# Patient Record
Sex: Male | Born: 1940 | Race: White | Hispanic: No | Marital: Married | Smoking: Former smoker
Health system: Southern US, Community
[De-identification: ages and names within clinical notes are randomized; demographics above are authoritative.]

## PROBLEM LIST (undated history)

## (undated) DIAGNOSIS — E781 Pure hyperglyceridemia: Secondary | ICD-10-CM

## (undated) DIAGNOSIS — E041 Nontoxic single thyroid nodule: Secondary | ICD-10-CM

## (undated) DIAGNOSIS — I10 Essential (primary) hypertension: Secondary | ICD-10-CM

## (undated) DIAGNOSIS — G44009 Cluster headache syndrome, unspecified, not intractable: Secondary | ICD-10-CM

## (undated) DIAGNOSIS — E1140 Type 2 diabetes mellitus with diabetic neuropathy, unspecified: Secondary | ICD-10-CM

## (undated) HISTORY — DX: Pure hyperglyceridemia: E78.1

## (undated) HISTORY — DX: Type 2 diabetes mellitus with diabetic neuropathy, unspecified: E11.40

## (undated) HISTORY — DX: Cluster headache syndrome, unspecified, not intractable: G44.009

## (undated) HISTORY — DX: Nontoxic single thyroid nodule: E04.1

## (undated) HISTORY — PX: PROSTATE BIOPSY: SHX241

## (undated) HISTORY — DX: Essential (primary) hypertension: I10

---

## 2000-06-26 HISTORY — PX: ESOPHAGEAL DILATION: SHX303

## 2003-06-29 HISTORY — PX: POLYPECTOMY: SHX149

## 2013-03-14 DIAGNOSIS — K449 Diaphragmatic hernia without obstruction or gangrene: Secondary | ICD-10-CM

## 2013-03-14 HISTORY — DX: Diaphragmatic hernia without obstruction or gangrene: K44.9

## 2013-07-29 DIAGNOSIS — I10 Essential (primary) hypertension: Secondary | ICD-10-CM | POA: Diagnosis not present

## 2013-07-29 DIAGNOSIS — E7800 Pure hypercholesterolemia, unspecified: Secondary | ICD-10-CM | POA: Diagnosis not present

## 2013-07-29 DIAGNOSIS — Z23 Encounter for immunization: Secondary | ICD-10-CM | POA: Diagnosis not present

## 2013-07-29 DIAGNOSIS — E119 Type 2 diabetes mellitus without complications: Secondary | ICD-10-CM | POA: Diagnosis not present

## 2013-07-29 DIAGNOSIS — G44009 Cluster headache syndrome, unspecified, not intractable: Secondary | ICD-10-CM | POA: Diagnosis not present

## 2013-12-02 DIAGNOSIS — Z23 Encounter for immunization: Secondary | ICD-10-CM | POA: Diagnosis not present

## 2014-01-11 DIAGNOSIS — E119 Type 2 diabetes mellitus without complications: Secondary | ICD-10-CM | POA: Diagnosis not present

## 2014-01-19 DIAGNOSIS — E119 Type 2 diabetes mellitus without complications: Secondary | ICD-10-CM | POA: Diagnosis not present

## 2014-01-19 DIAGNOSIS — Z0000 Encounter for general adult medical examination without abnormal findings: Secondary | ICD-10-CM | POA: Diagnosis not present

## 2014-01-19 DIAGNOSIS — I10 Essential (primary) hypertension: Secondary | ICD-10-CM | POA: Diagnosis not present

## 2014-07-15 DIAGNOSIS — E119 Type 2 diabetes mellitus without complications: Secondary | ICD-10-CM | POA: Diagnosis not present

## 2014-07-22 DIAGNOSIS — I10 Essential (primary) hypertension: Secondary | ICD-10-CM | POA: Diagnosis not present

## 2014-07-22 DIAGNOSIS — Z1389 Encounter for screening for other disorder: Secondary | ICD-10-CM | POA: Diagnosis not present

## 2014-07-22 DIAGNOSIS — Z9181 History of falling: Secondary | ICD-10-CM | POA: Diagnosis not present

## 2014-07-22 DIAGNOSIS — E119 Type 2 diabetes mellitus without complications: Secondary | ICD-10-CM | POA: Diagnosis not present

## 2014-11-17 DIAGNOSIS — Z23 Encounter for immunization: Secondary | ICD-10-CM | POA: Diagnosis not present

## 2014-12-30 DIAGNOSIS — D1801 Hemangioma of skin and subcutaneous tissue: Secondary | ICD-10-CM | POA: Diagnosis not present

## 2014-12-30 DIAGNOSIS — L821 Other seborrheic keratosis: Secondary | ICD-10-CM | POA: Diagnosis not present

## 2014-12-30 DIAGNOSIS — L219 Seborrheic dermatitis, unspecified: Secondary | ICD-10-CM | POA: Diagnosis not present

## 2015-02-04 DIAGNOSIS — E119 Type 2 diabetes mellitus without complications: Secondary | ICD-10-CM | POA: Diagnosis not present

## 2015-02-10 DIAGNOSIS — E119 Type 2 diabetes mellitus without complications: Secondary | ICD-10-CM | POA: Diagnosis not present

## 2015-02-10 DIAGNOSIS — Z23 Encounter for immunization: Secondary | ICD-10-CM | POA: Diagnosis not present

## 2015-02-10 DIAGNOSIS — I10 Essential (primary) hypertension: Secondary | ICD-10-CM | POA: Diagnosis not present

## 2015-02-10 DIAGNOSIS — D649 Anemia, unspecified: Secondary | ICD-10-CM | POA: Diagnosis not present

## 2015-02-10 DIAGNOSIS — Z0000 Encounter for general adult medical examination without abnormal findings: Secondary | ICD-10-CM | POA: Diagnosis not present

## 2015-02-15 DIAGNOSIS — Z1211 Encounter for screening for malignant neoplasm of colon: Secondary | ICD-10-CM | POA: Diagnosis not present

## 2015-10-24 ENCOUNTER — Other Ambulatory Visit: Payer: Self-pay

## 2015-11-11 DIAGNOSIS — E119 Type 2 diabetes mellitus without complications: Secondary | ICD-10-CM | POA: Diagnosis not present

## 2015-11-15 DIAGNOSIS — E119 Type 2 diabetes mellitus without complications: Secondary | ICD-10-CM | POA: Diagnosis not present

## 2015-11-15 DIAGNOSIS — Z23 Encounter for immunization: Secondary | ICD-10-CM | POA: Diagnosis not present

## 2015-11-15 DIAGNOSIS — Z0000 Encounter for general adult medical examination without abnormal findings: Secondary | ICD-10-CM | POA: Diagnosis not present

## 2015-11-15 DIAGNOSIS — I10 Essential (primary) hypertension: Secondary | ICD-10-CM | POA: Diagnosis not present

## 2015-11-15 DIAGNOSIS — Z125 Encounter for screening for malignant neoplasm of prostate: Secondary | ICD-10-CM | POA: Diagnosis not present

## 2016-02-06 DIAGNOSIS — M25561 Pain in right knee: Secondary | ICD-10-CM | POA: Diagnosis not present

## 2016-02-06 DIAGNOSIS — S20211A Contusion of right front wall of thorax, initial encounter: Secondary | ICD-10-CM | POA: Diagnosis not present

## 2016-02-06 DIAGNOSIS — M542 Cervicalgia: Secondary | ICD-10-CM | POA: Diagnosis not present

## 2016-02-06 DIAGNOSIS — M545 Low back pain: Secondary | ICD-10-CM | POA: Diagnosis not present

## 2016-07-30 DIAGNOSIS — E119 Type 2 diabetes mellitus without complications: Secondary | ICD-10-CM | POA: Diagnosis not present

## 2016-08-01 DIAGNOSIS — I10 Essential (primary) hypertension: Secondary | ICD-10-CM | POA: Diagnosis not present

## 2016-08-01 DIAGNOSIS — E119 Type 2 diabetes mellitus without complications: Secondary | ICD-10-CM | POA: Diagnosis not present

## 2016-08-01 DIAGNOSIS — Z6826 Body mass index (BMI) 26.0-26.9, adult: Secondary | ICD-10-CM | POA: Diagnosis not present

## 2016-08-01 DIAGNOSIS — Z0000 Encounter for general adult medical examination without abnormal findings: Secondary | ICD-10-CM | POA: Diagnosis not present

## 2017-01-01 DIAGNOSIS — Z23 Encounter for immunization: Secondary | ICD-10-CM | POA: Diagnosis not present

## 2017-01-02 DIAGNOSIS — E119 Type 2 diabetes mellitus without complications: Secondary | ICD-10-CM | POA: Diagnosis not present

## 2017-01-03 DIAGNOSIS — L300 Nummular dermatitis: Secondary | ICD-10-CM | POA: Diagnosis not present

## 2017-01-03 DIAGNOSIS — L299 Pruritus, unspecified: Secondary | ICD-10-CM | POA: Diagnosis not present

## 2017-01-03 DIAGNOSIS — L98499 Non-pressure chronic ulcer of skin of other sites with unspecified severity: Secondary | ICD-10-CM | POA: Diagnosis not present

## 2017-01-07 DIAGNOSIS — Z9181 History of falling: Secondary | ICD-10-CM | POA: Diagnosis not present

## 2017-01-07 DIAGNOSIS — Z1339 Encounter for screening examination for other mental health and behavioral disorders: Secondary | ICD-10-CM | POA: Diagnosis not present

## 2017-01-07 DIAGNOSIS — E119 Type 2 diabetes mellitus without complications: Secondary | ICD-10-CM | POA: Diagnosis not present

## 2017-01-07 DIAGNOSIS — Z1331 Encounter for screening for depression: Secondary | ICD-10-CM | POA: Diagnosis not present

## 2017-03-25 DIAGNOSIS — C44319 Basal cell carcinoma of skin of other parts of face: Secondary | ICD-10-CM | POA: Diagnosis not present

## 2017-04-04 DIAGNOSIS — E119 Type 2 diabetes mellitus without complications: Secondary | ICD-10-CM | POA: Diagnosis not present

## 2017-04-08 DIAGNOSIS — Z1331 Encounter for screening for depression: Secondary | ICD-10-CM | POA: Diagnosis not present

## 2017-04-08 DIAGNOSIS — E119 Type 2 diabetes mellitus without complications: Secondary | ICD-10-CM | POA: Diagnosis not present

## 2017-04-08 DIAGNOSIS — Z9181 History of falling: Secondary | ICD-10-CM | POA: Diagnosis not present

## 2017-04-08 DIAGNOSIS — Z6827 Body mass index (BMI) 27.0-27.9, adult: Secondary | ICD-10-CM | POA: Diagnosis not present

## 2017-11-06 DIAGNOSIS — E119 Type 2 diabetes mellitus without complications: Secondary | ICD-10-CM | POA: Diagnosis not present

## 2017-11-08 DIAGNOSIS — Z23 Encounter for immunization: Secondary | ICD-10-CM | POA: Diagnosis not present

## 2017-11-08 DIAGNOSIS — E119 Type 2 diabetes mellitus without complications: Secondary | ICD-10-CM | POA: Diagnosis not present

## 2017-11-08 DIAGNOSIS — I10 Essential (primary) hypertension: Secondary | ICD-10-CM | POA: Diagnosis not present

## 2017-11-08 DIAGNOSIS — Z0000 Encounter for general adult medical examination without abnormal findings: Secondary | ICD-10-CM | POA: Diagnosis not present

## 2017-11-08 DIAGNOSIS — Z6826 Body mass index (BMI) 26.0-26.9, adult: Secondary | ICD-10-CM | POA: Diagnosis not present

## 2018-03-19 DIAGNOSIS — B079 Viral wart, unspecified: Secondary | ICD-10-CM | POA: Diagnosis not present

## 2018-04-09 DIAGNOSIS — B079 Viral wart, unspecified: Secondary | ICD-10-CM | POA: Diagnosis not present

## 2018-06-13 DIAGNOSIS — E119 Type 2 diabetes mellitus without complications: Secondary | ICD-10-CM | POA: Diagnosis not present

## 2018-06-18 DIAGNOSIS — Z6827 Body mass index (BMI) 27.0-27.9, adult: Secondary | ICD-10-CM | POA: Diagnosis not present

## 2018-06-18 DIAGNOSIS — Z9181 History of falling: Secondary | ICD-10-CM | POA: Diagnosis not present

## 2018-06-18 DIAGNOSIS — E1140 Type 2 diabetes mellitus with diabetic neuropathy, unspecified: Secondary | ICD-10-CM | POA: Diagnosis not present

## 2018-06-18 DIAGNOSIS — Z1331 Encounter for screening for depression: Secondary | ICD-10-CM | POA: Diagnosis not present

## 2018-11-08 DIAGNOSIS — Z23 Encounter for immunization: Secondary | ICD-10-CM | POA: Diagnosis not present

## 2018-12-17 DIAGNOSIS — E1140 Type 2 diabetes mellitus with diabetic neuropathy, unspecified: Secondary | ICD-10-CM | POA: Diagnosis not present

## 2018-12-19 DIAGNOSIS — Z0000 Encounter for general adult medical examination without abnormal findings: Secondary | ICD-10-CM | POA: Diagnosis not present

## 2018-12-19 DIAGNOSIS — Z1331 Encounter for screening for depression: Secondary | ICD-10-CM | POA: Diagnosis not present

## 2018-12-19 DIAGNOSIS — Z6826 Body mass index (BMI) 26.0-26.9, adult: Secondary | ICD-10-CM | POA: Diagnosis not present

## 2018-12-19 DIAGNOSIS — E119 Type 2 diabetes mellitus without complications: Secondary | ICD-10-CM | POA: Diagnosis not present

## 2019-02-26 DIAGNOSIS — I10 Essential (primary) hypertension: Secondary | ICD-10-CM | POA: Diagnosis not present

## 2019-02-26 DIAGNOSIS — E1140 Type 2 diabetes mellitus with diabetic neuropathy, unspecified: Secondary | ICD-10-CM | POA: Diagnosis not present

## 2019-02-26 DIAGNOSIS — E781 Pure hyperglyceridemia: Secondary | ICD-10-CM | POA: Diagnosis not present

## 2019-05-25 DIAGNOSIS — E041 Nontoxic single thyroid nodule: Secondary | ICD-10-CM | POA: Diagnosis not present

## 2019-05-25 DIAGNOSIS — Z79899 Other long term (current) drug therapy: Secondary | ICD-10-CM | POA: Diagnosis not present

## 2019-05-25 DIAGNOSIS — E1140 Type 2 diabetes mellitus with diabetic neuropathy, unspecified: Secondary | ICD-10-CM | POA: Diagnosis not present

## 2019-05-27 DIAGNOSIS — Z6827 Body mass index (BMI) 27.0-27.9, adult: Secondary | ICD-10-CM | POA: Diagnosis not present

## 2019-05-27 DIAGNOSIS — Z9181 History of falling: Secondary | ICD-10-CM | POA: Diagnosis not present

## 2019-05-27 DIAGNOSIS — E1165 Type 2 diabetes mellitus with hyperglycemia: Secondary | ICD-10-CM | POA: Diagnosis not present

## 2019-05-27 DIAGNOSIS — E1140 Type 2 diabetes mellitus with diabetic neuropathy, unspecified: Secondary | ICD-10-CM | POA: Diagnosis not present

## 2019-05-27 DIAGNOSIS — Z1331 Encounter for screening for depression: Secondary | ICD-10-CM | POA: Diagnosis not present

## 2019-05-27 DIAGNOSIS — E781 Pure hyperglyceridemia: Secondary | ICD-10-CM | POA: Diagnosis not present

## 2019-05-27 DIAGNOSIS — I10 Essential (primary) hypertension: Secondary | ICD-10-CM | POA: Diagnosis not present

## 2019-06-05 DIAGNOSIS — L720 Epidermal cyst: Secondary | ICD-10-CM | POA: Diagnosis not present

## 2019-06-24 DIAGNOSIS — K222 Esophageal obstruction: Secondary | ICD-10-CM | POA: Diagnosis not present

## 2019-06-26 DIAGNOSIS — Z20828 Contact with and (suspected) exposure to other viral communicable diseases: Secondary | ICD-10-CM | POA: Diagnosis not present

## 2019-06-26 DIAGNOSIS — N4340 Spermatocele of epididymis, unspecified: Secondary | ICD-10-CM | POA: Diagnosis not present

## 2019-06-26 DIAGNOSIS — R3911 Hesitancy of micturition: Secondary | ICD-10-CM | POA: Diagnosis not present

## 2019-06-26 DIAGNOSIS — Z1159 Encounter for screening for other viral diseases: Secondary | ICD-10-CM | POA: Diagnosis not present

## 2019-06-26 DIAGNOSIS — J3489 Other specified disorders of nose and nasal sinuses: Secondary | ICD-10-CM | POA: Diagnosis not present

## 2019-06-26 DIAGNOSIS — N401 Enlarged prostate with lower urinary tract symptoms: Secondary | ICD-10-CM | POA: Diagnosis not present

## 2019-07-02 DIAGNOSIS — Z808 Family history of malignant neoplasm of other organs or systems: Secondary | ICD-10-CM | POA: Diagnosis not present

## 2019-07-02 DIAGNOSIS — E119 Type 2 diabetes mellitus without complications: Secondary | ICD-10-CM | POA: Diagnosis not present

## 2019-07-02 DIAGNOSIS — Z1211 Encounter for screening for malignant neoplasm of colon: Secondary | ICD-10-CM | POA: Diagnosis not present

## 2019-07-02 DIAGNOSIS — Z09 Encounter for follow-up examination after completed treatment for conditions other than malignant neoplasm: Secondary | ICD-10-CM | POA: Diagnosis not present

## 2019-07-02 DIAGNOSIS — K648 Other hemorrhoids: Secondary | ICD-10-CM | POA: Diagnosis not present

## 2019-07-02 DIAGNOSIS — Z79899 Other long term (current) drug therapy: Secondary | ICD-10-CM | POA: Diagnosis not present

## 2019-07-02 DIAGNOSIS — Z7982 Long term (current) use of aspirin: Secondary | ICD-10-CM | POA: Diagnosis not present

## 2019-07-02 DIAGNOSIS — I10 Essential (primary) hypertension: Secondary | ICD-10-CM | POA: Diagnosis not present

## 2019-07-02 DIAGNOSIS — Z86010 Personal history of colonic polyps: Secondary | ICD-10-CM | POA: Diagnosis not present

## 2019-08-12 DIAGNOSIS — R3911 Hesitancy of micturition: Secondary | ICD-10-CM | POA: Diagnosis not present

## 2019-08-12 DIAGNOSIS — N401 Enlarged prostate with lower urinary tract symptoms: Secondary | ICD-10-CM | POA: Diagnosis not present

## 2019-08-12 DIAGNOSIS — N4340 Spermatocele of epididymis, unspecified: Secondary | ICD-10-CM | POA: Diagnosis not present

## 2019-09-15 DIAGNOSIS — R079 Chest pain, unspecified: Secondary | ICD-10-CM | POA: Diagnosis not present

## 2019-09-18 ENCOUNTER — Encounter: Payer: Self-pay | Admitting: Cardiology

## 2019-09-18 DIAGNOSIS — G44009 Cluster headache syndrome, unspecified, not intractable: Secondary | ICD-10-CM

## 2019-09-18 DIAGNOSIS — E781 Pure hyperglyceridemia: Secondary | ICD-10-CM

## 2019-09-18 DIAGNOSIS — I10 Essential (primary) hypertension: Secondary | ICD-10-CM | POA: Insufficient documentation

## 2019-09-18 DIAGNOSIS — E785 Hyperlipidemia, unspecified: Secondary | ICD-10-CM | POA: Insufficient documentation

## 2019-09-18 DIAGNOSIS — E1140 Type 2 diabetes mellitus with diabetic neuropathy, unspecified: Secondary | ICD-10-CM | POA: Insufficient documentation

## 2019-09-18 DIAGNOSIS — E041 Nontoxic single thyroid nodule: Secondary | ICD-10-CM

## 2019-09-28 ENCOUNTER — Ambulatory Visit (INDEPENDENT_AMBULATORY_CARE_PROVIDER_SITE_OTHER): Payer: Medicare Other

## 2019-09-28 ENCOUNTER — Encounter: Payer: Self-pay | Admitting: Cardiology

## 2019-09-28 ENCOUNTER — Ambulatory Visit (INDEPENDENT_AMBULATORY_CARE_PROVIDER_SITE_OTHER): Payer: Medicare Other | Admitting: Cardiology

## 2019-09-28 ENCOUNTER — Other Ambulatory Visit: Payer: Self-pay

## 2019-09-28 VITALS — BP 128/58 | HR 84 | Ht 72.0 in | Wt 201.4 lb

## 2019-09-28 DIAGNOSIS — E119 Type 2 diabetes mellitus without complications: Secondary | ICD-10-CM | POA: Insufficient documentation

## 2019-09-28 DIAGNOSIS — I10 Essential (primary) hypertension: Secondary | ICD-10-CM | POA: Diagnosis not present

## 2019-09-28 DIAGNOSIS — R079 Chest pain, unspecified: Secondary | ICD-10-CM

## 2019-09-28 DIAGNOSIS — R0989 Other specified symptoms and signs involving the circulatory and respiratory systems: Secondary | ICD-10-CM

## 2019-09-28 DIAGNOSIS — I493 Ventricular premature depolarization: Secondary | ICD-10-CM

## 2019-09-28 DIAGNOSIS — R931 Abnormal findings on diagnostic imaging of heart and coronary circulation: Secondary | ICD-10-CM

## 2019-09-28 HISTORY — DX: Abnormal findings on diagnostic imaging of heart and coronary circulation: R93.1

## 2019-09-28 HISTORY — DX: Type 2 diabetes mellitus without complications: E11.9

## 2019-09-28 HISTORY — DX: Ventricular premature depolarization: I49.3

## 2019-09-28 HISTORY — DX: Other specified symptoms and signs involving the circulatory and respiratory systems: R09.89

## 2019-09-28 HISTORY — DX: Chest pain, unspecified: R07.9

## 2019-09-28 MED ORDER — NITROGLYCERIN 0.4 MG SL SUBL
0.4000 mg | SUBLINGUAL_TABLET | SUBLINGUAL | 3 refills | Status: DC | PRN
Start: 2019-09-28 — End: 2023-06-20

## 2019-09-28 NOTE — Patient Instructions (Addendum)
Medication Instructions:  Start: Nitroglycerin 0.4 mg every 5 minutes as needed for chest pain    *If you need a refill on your cardiac medications before your next appointment, please call your pharmacy*   Lab Work: None ordered   If you have labs (blood work) drawn today and your tests are completely normal, you will receive your results only by: Marland Kitchen MyChart Message (if you have MyChart) OR . A paper copy in the mail If you have any lab test that is abnormal or we need to change your treatment, we will call you to review the results.   Testing/Procedures: Your physician has requested that you have an echocardiogram. Echocardiography is a painless test that uses sound waves to create images of your heart. It provides your doctor with information about the size and shape of your heart and how well your heart's chambers and valves are working. This procedure takes approximately one hour. There are no restrictions for this procedure.  A zio monitor was ordered today. It will remain on for 3 days. You will then return monitor and event diary in provided box. It takes 1-2 weeks for report to be downloaded and returned to [place]. We will call you with the results. If monitor falls off or has orange flashing light, please call [employer]io for further instructions.     Follow-Up: At [facility], you and your health needs are our priority.  As part of our continuing mission to provide you with exceptional heart care, we have created designated Provider Care Teams.  These Care Teams include your primary Cardiologist (physician) and Advanced Practice Providers (APPs -  Physician Assistants and Nurse Practitioners) who all work together to provide you with the care you need, when you need it.  We recommend signing up for the patient portal called "MyChart".  Sign up information is provided on this After Visit Summary.  MyChart is used to connect with patients for Virtual Visits (Telemedicine).  Patients are able  to view lab/test results, encounter notes, upcoming appointments, etc.  Non-urgent messages can be sent to your provider as well.   To learn more about what you can do with MyChart, go to [email or web address].    Your next appointment:   1 month(s)  The format for your next appointment:   In Person  Provider:   Berniece Salines, DO   Other Instructions Nitroglycerin sublingual tablets What is this medicine? NITROGLYCERIN (nye troe GLI ser in) is a type of vasodilator. It relaxes blood vessels, increasing the blood and oxygen supply to your heart. This medicine is used to relieve chest pain caused by angina. It is also used to prevent chest pain before activities like climbing stairs, going outdoors in cold weather, or sexual activity. This medicine may be used for other purposes; ask your health care provider or pharmacist if you have questions. COMMON BRAND NAME(S): Nitroquick, Nitrostat, Nitrotab What should I tell my health care provider before I take this medicine? They need to know if you have any of these conditions:  anemia  head injury, recent stroke, or bleeding in the brain  liver disease  previous heart attack  an unusual or allergic reaction to nitroglycerin, other medicines, foods, dyes, or preservatives  pregnant or trying to get pregnant  breast-feeding How should I use this medicine? Take this medicine by mouth as needed. At the first sign of an angina attack (chest pain or tightness) place one tablet under your tongue. You can also take this medicine 5 to  10 minutes before an event likely to produce chest pain. Follow the directions on the prescription label. Let the tablet dissolve under the tongue. Do not swallow whole. Replace the dose if you accidentally swallow it. It will help if your mouth is not dry. Saliva around the tablet will help it to dissolve more quickly. Do not eat or drink, smoke or chew tobacco while a tablet is dissolving. If you are not better  within 5 minutes after taking ONE dose of nitroglycerin, call 9-1-1 immediately to seek emergency medical care. Do not take more than 3 nitroglycerin tablets over 15 minutes. If you take this medicine often to relieve symptoms of angina, your doctor or health care professional may provide you with different instructions to manage your symptoms. If symptoms do not go away after following these instructions, it is important to call 9-1-1 immediately. Do not take more than 3 nitroglycerin tablets over 15 minutes. Talk to your pediatrician regarding the use of this medicine in children. Special care may be needed. Overdosage: If you think you have taken too much of this medicine contact a poison control center or emergency room at once. NOTE: This medicine is only for you. Do not share this medicine with others. What if I miss a dose? This does not apply. This medicine is only used as needed. What may interact with this medicine? Do not take this medicine with any of the following medications:  certain migraine medicines like ergotamine and dihydroergotamine (DHE)  medicines used to treat erectile dysfunction like sildenafil, tadalafil, and vardenafil  riociguat This medicine may also interact with the following medications:  alteplase  aspirin  heparin  medicines for high blood pressure  medicines for mental depression  other medicines used to treat angina  phenothiazines like chlorpromazine, mesoridazine, prochlorperazine, thioridazine This list may not describe all possible interactions. Give your health care provider a list of all the medicines, herbs, non-prescription drugs, or dietary supplements you use. Also tell them if you smoke, drink alcohol, or use illegal drugs. Some items may interact with your medicine. What should I watch for while using this medicine? Tell your doctor or health care professional if you feel your medicine is no longer working. Keep this medicine with you  at all times. Sit or lie down when you take your medicine to prevent falling if you feel dizzy or faint after using it. Try to remain calm. This will help you to feel better faster. If you feel dizzy, take several deep breaths and lie down with your feet propped up, or bend forward with your head resting between your knees. You may get drowsy or dizzy. Do not drive, use machinery, or do anything that needs mental alertness until you know how this drug affects you. Do not stand or sit up quickly, especially if you are an older patient. This reduces the risk of dizzy or fainting spells. Alcohol can make you more drowsy and dizzy. Avoid alcoholic drinks. Do not treat yourself for coughs, colds, or pain while you are taking this medicine without asking your doctor or health care professional for advice. Some ingredients may increase your blood pressure. What side effects may I notice from receiving this medicine? Side effects that you should report to your doctor or health care professional as soon as possible:  blurred vision  dry mouth  skin rash  sweating  the feeling of extreme pressure in the head  unusually weak or tired Side effects that usually do not require medical  attention (report to your doctor or health care professional if they continue or are bothersome):  flushing of the face or neck  headache  irregular heartbeat, palpitations  nausea, vomiting This list may not describe all possible side effects. Call your doctor for medical advice about side effects. You may report side effects to FDA at 1-800-FDA-1088. Where should I keep my medicine? Keep out of the reach of children. Store at room temperature between 20 and 25 degrees C (68 and 77 degrees F). Store in [employer]. Protect from light and moisture. Keep tightly closed. Throw away any unused medicine after the expiration date. NOTE: This sheet is a summary. It may not cover all possible information. If you have  questions about this medicine, talk to your doctor, pharmacist, or health care provider.  2020 [employer] Standard (2012-12-11 17:57:36)

## 2019-09-28 NOTE — Progress Notes (Signed)
Cardiology Office Note:    Date:  09/28/2019   ID:  Donald Arias, DOB [birth year], MRN [id number]  PCP:  Ronita Hipps, MD  Cardiologist:  Berniece Salines, DO  Electrophysiologist:  None   Referring MD: Ronita Hipps, MD   " I was referred from her PCP because of my stress test."  History of Present Illness:    Donald Arias is a 79 y.o. male with a hx of diabetes mellitus, hypertension, hyperlipidemia presents today after being referred by his PCP due to a intermediate risk stress test that was done at [facility]. Patient tells me that he has been experiencing intermittent left-sided chest pain prior to seeing his primary care provider who recommended he get a stress test.  The stress test at [facility] showed no infarction or ischemia however due to depressed ejection fraction this was deemed to be intermediate risk study.  Today he notes that he still does have some intermittent chest pain but not as much as prior to stress test.  He denies palpitations.  But does tell me he feels skipped beats at times.  No other complaints at this time.  Patient is here today with his wife.   Past Medical History:  Diagnosis Date   Diabetes mellitus with neuropathy ([place])    Hiatal hernia 03/14/2013   Found during EGD procedure   Hypertension    Hypertriglyceridemia    Migraine-cluster headache syndrome    Thyroid nodule     Past Surgical History:  Procedure Laterality Date   ESOPHAGEAL DILATION  06/2000   Mild esophageal stricture dilated, Dr. Lyda Jester   POLYPECTOMY  06/29/2003   Diminutive Hyperplastic polyps   PROSTATE BIOPSY     For elevated PSA    Current Medications: Current Meds  Medication Sig   aspirin EC 81 MG tablet Take 81 mg by mouth daily. Swallow whole.   cholecalciferol (VITAMIN D3) 25 MCG (1000 UNIT) tablet Take 1,000 Units by mouth daily.   CINNAMON PO Take 1 capsule by mouth daily.   Coenzyme Q10 100 MG TABS Take 1 tablet by mouth daily.     lisinopril-hydrochlorothiazide (ZESTORETIC) 10-12.5 MG tablet Take 1 tablet by mouth daily.   Multiple Vitamins-Minerals (CENTRUM SILVER PO) Take 1 tablet by mouth daily.   Omega-3 Fatty Acids (OMEGA 3 500 PO) Take 1 capsule by mouth daily.   sitaGLIPtin (JANUVIA) 100 MG tablet Take 100 mg by mouth daily.   TURMERIC PO Take 1 tablet by mouth daily.   verapamil (CALAN-SR) 240 MG CR tablet Take 240 mg by mouth at bedtime.     Allergies:   Patient has no known allergies.   Social History   Socioeconomic History   Marital status: Married    Spouse name: Not on file   Number of children: Not on file   Years of education: Not on file   Highest education level: Not on file  Occupational History   Not on file  Tobacco Use   Smoking status: Former Smoker    Types: Cigarettes   Smokeless tobacco: Never Used  Substance and Sexual Activity   Alcohol use: Yes    Comment: Bottle of wine per week   Drug use: Not on file   Sexual activity: Not on file  Other Topics Concern   Not on file  Social History Narrative   Not on file   Social Determinants of Health   Financial Resource Strain:    Difficulty of Paying Living Expenses:   Food  Insecurity:    Worried About Charity fundraiser in the Last Year:    Arboriculturist in the Last Year:   Transportation Needs:    Film/video editor (Medical):    Lack of Transportation (Non-Medical):   Physical Activity:    Days of Exercise per Week:    Minutes of Exercise per Session:   Stress:    Feeling of Stress :   Social Connections:    Frequency of Communication with Friends and Family:    Frequency of Social Gatherings with Friends and Family:    Attends Religious Services:    Active Member of Clubs or Organizations:    Attends Archivist Meetings:    Marital Status:      Family History: The patient's family history includes Lung cancer in his father.  ROS:   Review of Systems   Constitution: Negative for decreased appetite, fever and weight gain.  HENT: Negative for congestion, ear discharge, hoarse voice and sore throat.   Eyes: Negative for discharge, redness, vision loss in right eye and visual halos.  Cardiovascular: Reports chest pain.  Negative for dyspnea on exertion, leg swelling, orthopnea and palpitations.  Respiratory: Negative for cough, hemoptysis, shortness of breath and snoring.   Endocrine: Negative for heat intolerance and polyphagia.  Hematologic/Lymphatic: Negative for bleeding problem. Does not bruise/bleed easily.  Skin: Negative for flushing, nail changes, rash and suspicious lesions.  Musculoskeletal: Negative for arthritis, joint pain, muscle cramps, myalgias, neck pain and stiffness.  Gastrointestinal: Negative for abdominal pain, bowel incontinence, diarrhea and excessive appetite.  Genitourinary: Negative for decreased libido, genital sores and incomplete emptying.  Neurological: Negative for brief paralysis, focal weakness, headaches and loss of balance.  Psychiatric/Behavioral: Negative for altered mental status, depression and suicidal ideas.  Allergic/Immunologic: Negative for HIV exposure and persistent infections.    EKGs/Labs/Other Studies Reviewed:    The following studies were reviewed today:   EKG: Sinus rhythm, heart rate 84 bpm with occasional PVCs.  Stress test done on September 14, 2019 at [facility] showed no reversible ischemia or infarction.  Wall motion was normal.  Left ventricle ejection fraction 45%.  Deemed intermediate risk study due to ejection fraction.  Recent Labs: No results found for requested labs within last 8760 hours.  Recent Lipid Panel No results found for: CHOL, TRIG, HDL, CHOLHDL, VLDL, LDLCALC, LDLDIRECT  Physical Exam:    VS:  BP (!) 128/58 (BP Location: Left Arm)    Pulse 84    Ht 6' (1.829 m)    Wt 201 lb 6.4 oz (91.4 kg)    SpO2 97%    BMI 27.31 kg/m     Wt Readings from Last 3  Encounters:  09/28/19 201 lb 6.4 oz (91.4 kg)  09/07/19 201 lb (91.2 kg)     GEN: Well nourished, well developed in no acute distress HEENT: Normal NECK: No JVD; No carotid bruits LYMPHATICS: No lymphadenopathy CARDIAC: S1S2 noted,RRR, no murmurs, rubs, gallops RESPIRATORY:  Clear to auscultation without rales, wheezing or rhonchi  ABDOMEN: Soft, non-tender, non-distended, +bowel sounds, no guarding. EXTREMITIES: No edema, No cyanosis, no clubbing MUSCULOSKELETAL:  No deformity  SKIN: Warm and dry NEUROLOGIC:  Alert and oriented x 3, non-focal PSYCHIATRIC:  Normal affect, good insight  ASSESSMENT:    1. Essential hypertension   2. Type 2 diabetes mellitus without complication, without long-term current use of insulin ([place])   3. PVC (premature ventricular contraction)   4. Depressed left ventricular ejection fraction  5. Chest pain of uncertain etiology    PLAN:     I was able to review the patient stress test from [facility] does not show any reversible ischemia or infarction.  Has been some improvement in the chest pain but he said he gets this sometimes .  As needed sublingual nitroglycerin has been sent to the patient pharmacy.  I have educated patient how to use this medication.  Also advised the patient that if he experience chest pains that persist to go to the nearest emergency department.  In terms of his EF of 45% reported on the stress test.  I am going to repeat an echocardiogram to understand good assessment of his LV function.  If this is truly a depressed ejection fraction he will need to be on a low-dose beta-blocker as well.  Given the PVC noted patient was on his EKG I like to do a ZIO monitor for 3 days to understand the patient's PVC burden.   The patient and his wife are both in agreement with the above plan. The patient left the office in stable condition.  The patient will follow up in 1 month or sooner if needed.   Medication Adjustments/Labs and  Tests Ordered: Current medicines are reviewed at length with the patient today.  Concerns regarding medicines are outlined above.  Orders Placed This Encounter  Procedures   LONG TERM MONITOR (3-14 DAYS)   EKG 12-Lead   ECHOCARDIOGRAM COMPLETE   Meds ordered this encounter  Medications   nitroGLYCERIN (NITROSTAT) 0.4 MG SL tablet    Sig: Place 1 tablet (0.4 mg total) under the tongue every 5 (five) minutes as needed for chest pain.    Dispense:  25 tablet    Refill:  3    Patient Instructions  Medication Instructions:  Start: Nitroglycerin 0.4 mg every 5 minutes as needed for chest pain    *If you need a refill on your cardiac medications before your next appointment, please call your pharmacy*   Lab Work: None ordered   If you have labs (blood work) drawn today and your tests are completely normal, you will receive your results only by:  [place] (if you have MyChart) OR  A paper copy in the mail If you have any lab test that is abnormal or we need to change your treatment, we will call you to review the results.   Testing/Procedures: Your physician has requested that you have an echocardiogram. Echocardiography is a painless test that uses sound waves to create images of your heart. It provides your doctor with information about the size and shape of your heart and how well your hearts chambers and valves are working. This procedure takes approximately one hour. There are no restrictions for this procedure.  A zio monitor was ordered today. It will remain on for 3 days. You will then return monitor and event diary in provided box. It takes 1-2 weeks for report to be downloaded and returned to [place]. We will call you with the results. If monitor falls off or has orange flashing light, please call Zio for further instructions.     Follow-Up: At [facility], you and your health needs are our priority.  As part of our continuing mission to provide you with  exceptional heart care, we have created designated Provider Care Teams.  These Care Teams include your primary Cardiologist (physician) and Advanced Practice Providers (APPs -  Physician Assistants and Nurse Practitioners) who all work together to provide  you with the care you need, when you need it.  We recommend signing up for the patient portal called "MyChart".  Sign up information is provided on this After Visit Summary.  MyChart is used to connect with patients for Virtual Visits (Telemedicine).  Patients are able to view lab/test results, encounter notes, upcoming appointments, etc.  Non-urgent messages can be sent to your provider as well.   To learn more about what you can do with MyChart, go to [email or web address].    Your next appointment:   1 month(s)  The format for your next appointment:   In Person  Provider:   Berniece Salines, DO   Other Instructions Nitroglycerin sublingual tablets What is this medicine? NITROGLYCERIN (nye troe GLI ser in) is a type of vasodilator. It relaxes blood vessels, increasing the blood and oxygen supply to your heart. This medicine is used to relieve chest pain caused by angina. It is also used to prevent chest pain before activities like climbing stairs, going outdoors in cold weather, or sexual activity. This medicine may be used for other purposes; ask your health care provider or pharmacist if you have questions. COMMON BRAND NAME(S): Nitroquick, Nitrostat, Nitrotab What should I tell my health care provider before I take this medicine? They need to know if you have any of these conditions:  anemia  head injury, recent stroke, or bleeding in the brain  liver disease  previous heart attack  an unusual or allergic reaction to nitroglycerin, other medicines, foods, dyes, or preservatives  pregnant or trying to get pregnant  breast-feeding How should I use this medicine? Take this medicine by mouth as needed. At the first sign of an  angina attack (chest pain or tightness) place one tablet under your tongue. You can also take this medicine 5 to 10 minutes before an event likely to produce chest pain. Follow the directions on the prescription label. Let the tablet dissolve under the tongue. Do not swallow whole. Replace the dose if you accidentally swallow it. It will help if your mouth is not dry. Saliva around the tablet will help it to dissolve more quickly. Do not eat or drink, smoke or chew tobacco while a tablet is dissolving. If you are not better within 5 minutes after taking ONE dose of nitroglycerin, call 9-1-1 immediately to seek emergency medical care. Do not take more than 3 nitroglycerin tablets over 15 minutes. If you take this medicine often to relieve symptoms of angina, your doctor or health care professional may provide you with different instructions to manage your symptoms. If symptoms do not go away after following these instructions, it is important to call 9-1-1 immediately. Do not take more than 3 nitroglycerin tablets over 15 minutes. Talk to your pediatrician regarding the use of this medicine in children. Special care may be needed. Overdosage: If you think you have taken too much of this medicine contact a poison control center or emergency room at once. NOTE: This medicine is only for you. Do not share this medicine with others. What if I miss a dose? This does not apply. This medicine is only used as needed. What may interact with this medicine? Do not take this medicine with any of the following medications:  certain migraine medicines like ergotamine and dihydroergotamine (DHE)  medicines used to treat erectile dysfunction like sildenafil, tadalafil, and vardenafil  riociguat This medicine may also interact with the following medications:  alteplase  aspirin  heparin  medicines for high blood pressure  medicines  for mental depression  other medicines used to treat  angina  phenothiazines like chlorpromazine, mesoridazine, prochlorperazine, thioridazine This list may not describe all possible interactions. Give your health care provider a list of all the medicines, herbs, non-prescription drugs, or dietary supplements you use. Also tell them if you smoke, drink alcohol, or use illegal drugs. Some items may interact with your medicine. What should I watch for while using this medicine? Tell your doctor or health care professional if you feel your medicine is no longer working. Keep this medicine with you at all times. Sit or lie down when you take your medicine to prevent falling if you feel dizzy or faint after using it. Try to remain calm. This will help you to feel better faster. If you feel dizzy, take several deep breaths and lie down with your feet propped up, or bend forward with your head resting between your knees. You may get drowsy or dizzy. Do not drive, use machinery, or do anything that needs mental alertness until you know how this drug affects you. Do not stand or sit up quickly, especially if you are an older patient. This reduces the risk of dizzy or fainting spells. Alcohol can make you more drowsy and dizzy. Avoid alcoholic drinks. Do not treat yourself for coughs, colds, or pain while you are taking this medicine without asking your doctor or health care professional for advice. Some ingredients may increase your blood pressure. What side effects may I notice from receiving this medicine? Side effects that you should report to your doctor or health care professional as soon as possible:  blurred vision  dry mouth  skin rash  sweating  the feeling of extreme pressure in the head  unusually weak or tired Side effects that usually do not require medical attention (report to your doctor or health care professional if they continue or are bothersome):  flushing of the face or neck  headache  irregular heartbeat,  palpitations  nausea, vomiting This list may not describe all possible side effects. Call your doctor for medical advice about side effects. You may report side effects to FDA at 1-800-FDA-1088. Where should I keep my medicine? Keep out of the reach of children. Store at room temperature between 20 and 25 degrees C (68 and 77 degrees F). Store in [employer]. Protect from light and moisture. Keep tightly closed. Throw away any unused medicine after the expiration date. NOTE: This sheet is a summary. It may not cover all possible information. If you have questions about this medicine, talk to your doctor, pharmacist, or health care provider.  2020 Elsevier/Gold Standard (2012-12-11 17:57:36)      Adopting a Healthy Lifestyle.  Know what a healthy weight is for you (roughly BMI <25) and aim to maintain this   Aim for 7+ servings of fruits and vegetables daily   65-80+ fluid ounces of water or unsweet tea for healthy kidneys   Limit to max 1 drink of alcohol per day; avoid smoking/tobacco   Limit animal fats in diet for cholesterol and heart health - choose grass fed whenever available   Avoid highly processed foods, and foods high in saturated/trans fats   Aim for low stress - take time to unwind and care for your mental health   Aim for 150 min of moderate intensity exercise weekly for heart health, and weights twice weekly for bone health   Aim for 7-9 hours of sleep daily   When it comes to diets, agreement about  the perfect plan isnt easy to find, even among the experts. Experts at the [employer] developed an idea known as the Healthy Eating Plate. Just imagine a plate divided into logical, healthy portions.   The emphasis is on diet quality:   Load up on vegetables and fruits - one-half of your plate: Aim for color and variety, and remember that potatoes dont count.   Go for whole grains - one-quarter of your plate: Whole wheat, barley, wheat  berries, quinoa, oats, brown rice, and foods made with them. If you want pasta, go with whole wheat pasta.   Protein power - one-quarter of your plate: Fish, chicken, beans, and nuts are all healthy, versatile protein sources. Limit red meat.   The diet, however, does go beyond the plate, offering a few other suggestions.   Use healthy plant oils, such as olive, canola, soy, corn, sunflower and peanut. Check the labels, and avoid partially hydrogenated oil, which have unhealthy trans fats.   If youre thirsty, drink water. Coffee and tea are good in moderation, but skip sugary drinks and limit milk and dairy products to one or two daily servings.   The type of carbohydrate in the diet is more important than the amount. Some sources of carbohydrates, such as vegetables, fruits, whole grains, and beans-are healthier than others.   Finally, stay active  Signed, Berniece Salines, DO  09/28/2019 10:39 AM    [place]

## 2019-10-14 DIAGNOSIS — I493 Ventricular premature depolarization: Secondary | ICD-10-CM | POA: Diagnosis not present

## 2019-10-15 ENCOUNTER — Other Ambulatory Visit: Payer: Self-pay

## 2019-10-15 ENCOUNTER — Ambulatory Visit (INDEPENDENT_AMBULATORY_CARE_PROVIDER_SITE_OTHER): Payer: Medicare Other

## 2019-10-15 DIAGNOSIS — I10 Essential (primary) hypertension: Secondary | ICD-10-CM

## 2019-10-15 DIAGNOSIS — I493 Ventricular premature depolarization: Secondary | ICD-10-CM | POA: Diagnosis not present

## 2019-10-15 DIAGNOSIS — I77819 Aortic ectasia, unspecified site: Secondary | ICD-10-CM

## 2019-10-15 DIAGNOSIS — R0989 Other specified symptoms and signs involving the circulatory and respiratory systems: Secondary | ICD-10-CM | POA: Diagnosis not present

## 2019-10-15 LAB — ECHOCARDIOGRAM COMPLETE
Area-P 1/2: 4.17 cm2
Calc EF: 51.7 %
S' Lateral: 3.4 cm
Single Plane A2C EF: 51 %
Single Plane A4C EF: 52.4 %

## 2019-10-15 NOTE — Progress Notes (Unsigned)
Complete echocardiogram performed.  Jimmy Mellody Masri RDCS, RVT  

## 2019-10-29 ENCOUNTER — Ambulatory Visit (INDEPENDENT_AMBULATORY_CARE_PROVIDER_SITE_OTHER): Payer: Medicare Other | Admitting: Cardiology

## 2019-10-29 ENCOUNTER — Encounter: Payer: Self-pay | Admitting: Cardiology

## 2019-10-29 ENCOUNTER — Other Ambulatory Visit: Payer: Self-pay

## 2019-10-29 VITALS — BP 138/70 | HR 81 | Ht 72.0 in | Wt 202.0 lb

## 2019-10-29 DIAGNOSIS — E119 Type 2 diabetes mellitus without complications: Secondary | ICD-10-CM | POA: Diagnosis not present

## 2019-10-29 DIAGNOSIS — R072 Precordial pain: Secondary | ICD-10-CM

## 2019-10-29 DIAGNOSIS — I77819 Aortic ectasia, unspecified site: Secondary | ICD-10-CM

## 2019-10-29 DIAGNOSIS — I493 Ventricular premature depolarization: Secondary | ICD-10-CM | POA: Diagnosis not present

## 2019-10-29 DIAGNOSIS — I10 Essential (primary) hypertension: Secondary | ICD-10-CM | POA: Diagnosis not present

## 2019-10-29 DIAGNOSIS — E781 Pure hyperglyceridemia: Secondary | ICD-10-CM

## 2019-10-29 HISTORY — DX: Precordial pain: R07.2

## 2019-10-29 HISTORY — DX: Aortic ectasia, unspecified site: I77.819

## 2019-10-29 MED ORDER — METOPROLOL TARTRATE 50 MG PO TABS: 50.0000 mg | ORAL_TABLET | ORAL | 0 refills | Status: DC

## 2019-10-29 NOTE — Progress Notes (Signed)
Cardiology Office Note:    Date:  10/29/2019   ID:  Donald Arias, DOB [birth year], MRN [id number]  PCP:  Ronita Hipps, MD  Cardiologist:  Berniece Salines, DO  Electrophysiologist:  None   Referring MD: Ronita Hipps, MD   Follow up visit-I am still having chest pain  History of Present Illness:    Donald Arias is a 79 y.o. male with a hx of hypertension, hypertriglyceridemia, diabetes mellitus presented initially after he was at [facility] and his stress test did show medial ribs however there was no evidence of infarction or ischemia.  At his initial visit which was on September 28, 2019 he noted that he still was experiencing intermittent chest pain and told me he also felt that he was having some palpitation with heart skipping beats.  At that visit given the fact that the patient had no reversible ischemia on his stress test I recommended patient get some sublingual nitroglycerin and will monitor his his symptoms.  In the interim he had a echocardiogram done to assess his LV function giving that his EF was 45% on his stress test.  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   Today the patient is here for follow-up visit.  He tells me he still is experiencing intermittent left-sided chest pain.  His most recent episode was few days ago.  He notes that is mostly on exertion and sometimes at rest.  I did speak with the patient wife who tells me that she is worried about the symptoms and he is usually downplays it but this is occurring more than he tells.   Past Medical History:  Diagnosis Date   Diabetes mellitus with neuropathy ([place])    Hiatal hernia 03/14/2013   Found during EGD procedure   Hypertension    Hypertriglyceridemia    Migraine-cluster headache syndrome    Thyroid nodule     Past Surgical History:  Procedure Laterality Date   ESOPHAGEAL DILATION  06/2000   Mild esophageal stricture dilated, Dr.  Lyda Jester   POLYPECTOMY  06/29/2003   Diminutive Hyperplastic polyps   PROSTATE BIOPSY     For elevated PSA    Current Medications: Current Meds  Medication Sig   aspirin EC 81 MG tablet Take 81 mg by mouth daily. Swallow whole.   cholecalciferol (VITAMIN D3) 25 MCG (1000 UNIT) tablet Take 1,000 Units by mouth daily.   CINNAMON PO Take 1 capsule by mouth daily.   Coenzyme Q10 100 MG TABS Take 1 tablet by mouth daily.   lisinopril-hydrochlorothiazide (ZESTORETIC) 10-12.5 MG tablet Take 1 tablet by mouth daily.   Multiple Vitamins-Minerals (CENTRUM SILVER PO) Take 1 tablet by mouth daily.   nitroGLYCERIN (NITROSTAT) 0.4 MG SL tablet Place 1 tablet (0.4 mg total) under the tongue every 5 (five) minutes as needed for chest pain.   Omega-3 Fatty Acids (OMEGA 3 500 PO) Take 1 capsule by mouth daily.   sitaGLIPtin (JANUVIA) 100 MG tablet Take 100 mg by mouth daily.   TURMERIC PO Take 1 tablet by mouth daily.   verapamil (CALAN-SR) 240 MG CR tablet Take 240 mg by mouth at bedtime.     Allergies:   Patient has no known allergies.   Social History   Socioeconomic History   Marital status: Married    Spouse name: Not on file   Number of children: Not on file   Years of education: Not  on file   Highest education level: Not on file  Occupational History   Not on file  Tobacco Use   Smoking status: Former Smoker    Types: Cigarettes   Smokeless tobacco: Never Used  Substance and Sexual Activity   Alcohol use: Yes    Comment: Bottle of wine per week   Drug use: Not on file   Sexual activity: Not on file  Other Topics Concern   Not on file  Social History Narrative   Not on file   Social Determinants of Health   Financial Resource Strain:    Difficulty of Paying Living Expenses: Not on file  Food Insecurity:    Worried About [place] in the Last Year: Not on file   Ran Out of Food in the Last Year: Not on file  Transportation Needs:      Lack of Transportation (Medical): Not on file   Lack of Transportation (Non-Medical): Not on file  Physical Activity:    Days of Exercise per Week: Not on file   Minutes of Exercise per Session: Not on file  Stress:    Feeling of Stress : Not on file  Social Connections:    Frequency of Communication with Friends and Family: Not on file   Frequency of Social Gatherings with Friends and Family: Not on file   Attends Religious Services: Not on file   Active Member of Clubs or Organizations: Not on file   Attends Archivist Meetings: Not on file   Marital Status: Not on file     Family History: The patient's family history includes Lung cancer in his father.  ROS:   Review of Systems  Constitution: Negative for decreased appetite, fever and weight gain.  HENT: Negative for congestion, ear discharge, hoarse voice and sore throat.   Eyes: Negative for discharge, redness, vision loss in right eye and visual halos.  Cardiovascular: Negative for chest pain, dyspnea on exertion, leg swelling, orthopnea and palpitations.  Respiratory: Negative for cough, hemoptysis, shortness of breath and snoring.   Endocrine: Negative for heat intolerance and polyphagia.  Hematologic/Lymphatic: Negative for bleeding problem. Does not bruise/bleed easily.  Skin: Negative for flushing, nail changes, rash and suspicious lesions.  Musculoskeletal: Negative for arthritis, joint pain, muscle cramps, myalgias, neck pain and stiffness.  Gastrointestinal: Negative for abdominal pain, bowel incontinence, diarrhea and excessive appetite.  Genitourinary: Negative for decreased libido, genital sores and incomplete emptying.  Neurological: Negative for brief paralysis, focal weakness, headaches and loss of balance.  Psychiatric/Behavioral: Negative for altered mental status, depression and suicidal ideas.  Allergic/Immunologic: Negative for HIV exposure and persistent infections.     EKGs/Labs/Other Studies Reviewed:    The following studies were reviewed today:   EKG:  The ekg ordered today demonstrates   Echo IMPRESSIONS  10/15/2019 1. GLS -11.4%. Left ventricular ejection fraction, by estimation, is 50 to 55%. The left ventricle has low normal function. The left ventricle has  no regional wall motion abnormalities. There is mild left ventricular  hypertrophy. Left ventricular  diastolic parameters are consistent with Grade I diastolic dysfunction  (impaired relaxation).  2. Right ventricular systolic function is normal. The right ventricular  size is normal. There is mildly elevated pulmonary artery systolic  pressure.  3. The mitral valve is normal in structure. No evidence of mitral valve  regurgitation. No evidence of mitral stenosis.  4. The aortic valve is normal in structure. Aortic valve regurgitation is  not visualized. No aortic stenosis  is present.  5. There is mild to moderate dilatation of the ascending aorta measuring  40 mm.  6. The inferior vena cava is normal in size with greater than 50%  respiratory variability, suggesting right atrial pressure of 3 mmHg.   FINDINGS  Left Ventricle: GLS -11.4%. Left ventricular ejection fraction, by  estimation, is 50 to 55%. The left ventricle has low normal function. The  left ventricle has no regional wall motion abnormalities. The left  ventricular internal cavity size was normal  in size. There is mild left ventricular hypertrophy. Left ventricular  diastolic parameters are consistent with Grade I diastolic dysfunction  (impaired relaxation).   Right Ventricle: The right ventricular size is normal. No increase in  right ventricular wall thickness. Right ventricular systolic function is  normal. There is mildly elevated pulmonary artery systolic pressure. The  tricuspid regurgitant velocity is 2.87  m/s, and with an assumed right atrial pressure of 10 mmHg, the estimated  right  ventricular systolic pressure is 42.5 mmHg.   Left Atrium: Left atrial size was normal in size.   Right Atrium: Right atrial size was normal in size.   Pericardium: There is no evidence of pericardial effusion.   Mitral Valve: The mitral valve is normal in structure. Normal mobility of  the mitral valve leaflets. No evidence of mitral valve regurgitation. No  evidence of mitral valve stenosis.   Tricuspid Valve: The tricuspid valve is normal in structure. Tricuspid  valve regurgitation is mild . No evidence of tricuspid stenosis.   Aortic Valve: The aortic valve is normal in structure. Aortic valve  regurgitation is not visualized. No aortic stenosis is present.   Pulmonic Valve: The pulmonic valve was normal in structure. Pulmonic valve  regurgitation is not visualized. No evidence of pulmonic stenosis.   Aorta: The aortic root is normal in size and structure. There is mild to  moderate dilatation of the ascending aorta measuring 40 mm.   Venous: The inferior vena cava is normal in size with greater than 50%  respiratory variability, suggesting right atrial pressure of 3 mmHg.   IAS/Shunts: No atrial level shunt detected by color flow Doppler.   Recent Labs: No results found for requested labs within last 8760 hours.  Recent Lipid Panel No results found for: CHOL, TRIG, HDL, CHOLHDL, VLDL, LDLCALC, LDLDIRECT  Physical Exam:    VS:  BP 138/70    Pulse 81    Ht 6' (1.829 m)    Wt 202 lb (91.6 kg)    SpO2 98%    BMI 27.40 kg/m     Wt Readings from Last 3 Encounters:  10/29/19 202 lb (91.6 kg)  09/28/19 201 lb 6.4 oz (91.4 kg)  09/07/19 201 lb (91.2 kg)     GEN: Well nourished, well developed in no acute distress HEENT: Normal NECK: No JVD; No carotid bruits LYMPHATICS: No lymphadenopathy CARDIAC: S1S2 noted,RRR, no murmurs, rubs, gallops RESPIRATORY:  Clear to auscultation without rales, wheezing or rhonchi  ABDOMEN: Soft, non-tender, non-distended, +bowel sounds, no  guarding. EXTREMITIES: No edema, No cyanosis, no clubbing MUSCULOSKELETAL:  No deformity  SKIN: Warm and dry NEUROLOGIC:  Alert and oriented x 3, non-focal PSYCHIATRIC:  Normal affect, good insight  ASSESSMENT:    1. Precordial pain   2. Essential hypertension   3. PVC (premature ventricular contraction)   4. Type 2 diabetes mellitus without complication, without long-term current use of insulin ([place])   5. Hypertriglyceridemia   6. Dilatation of aorta ([facility])  PLAN:    Given the patient high risk factor for coronary artery disease based on his age, diabetes, hypertension, hyperlipidemia despite his negative pharmacologic stress test I like to proceed with a coronary CTA.  Hoping that this different diagnostic modality would give [place] more information about his coronary arteries because the patient still does have persistent intermittent symptoms. I spoke to the patient about coronary CTA he is agreeable to proceed with this.  He has no IV contrast dye allergies.  Diabetes mellitus is managed by his primary care doctor  Hypertension his blood pressure is acceptable in the office today no changes will be made  I also discussed with the patient about his results from his Zio patch.  Which showed rare PVCs as well as rare paroxysmal atrial tachycardia.  His questions has been answered today to his satisfaction.  I reviewed again the result of his echocardiogram which show EF of 50 to 55% and dilated ascending aorta.  He will get a chest CTA to assess the ascending aorta hopefully at the time of his coronary CTA.  The patient is in agreement with the above plan. The patient left the office in stable condition.  The patient will follow up in 3 months or sooner if needed peer   Medication Adjustments/Labs and Tests Ordered: Current medicines are reviewed at length with the patient today.  Concerns regarding medicines are outlined above.  No orders of the defined types were placed in this  encounter.  No orders of the defined types were placed in this encounter.   There are no Patient Instructions on file for this visit.   Adopting a Healthy Lifestyle.  Know what a healthy weight is for you (roughly BMI <25) and aim to maintain this   Aim for 7+ servings of fruits and vegetables daily   65-80+ fluid ounces of water or unsweet tea for healthy kidneys   Limit to max 1 drink of alcohol per day; avoid smoking/tobacco   Limit animal fats in diet for cholesterol and heart health - choose grass fed whenever available   Avoid highly processed foods, and foods high in saturated/trans fats   Aim for low stress - take time to unwind and care for your mental health   Aim for 150 min of moderate intensity exercise weekly for heart health, and weights twice weekly for bone health   Aim for 7-9 hours of sleep daily   When it comes to diets, agreement about the perfect plan isnt easy to find, even among the experts. Experts at the [employer] developed an idea known as the Healthy Eating Plate. Just imagine a plate divided into logical, healthy portions.   The emphasis is on diet quality:   Load up on vegetables and fruits - one-half of your plate: Aim for color and variety, and remember that potatoes dont count.   Go for whole grains - one-quarter of your plate: Whole wheat, barley, wheat berries, quinoa, oats, brown rice, and foods made with them. If you want pasta, go with whole wheat pasta.   Protein power - one-quarter of your plate: Fish, chicken, beans, and nuts are all healthy, versatile protein sources. Limit red meat.   The diet, however, does go beyond the plate, offering a few other suggestions.   Use healthy plant oils, such as olive, canola, soy, corn, sunflower and peanut. Check the labels, and avoid partially hydrogenated oil, which have unhealthy trans fats.   If youre thirsty, drink water.  Coffee and tea are good in moderation, but  skip sugary drinks and limit milk and dairy products to one or two daily servings.   The type of carbohydrate in the diet is more important than the amount. Some sources of carbohydrates, such as vegetables, fruits, whole grains, and beans-are healthier than others.   Finally, stay active  Signed, Berniece Salines, DO  10/29/2019 4:16 PM    [facility]

## 2019-10-29 NOTE — Patient Instructions (Addendum)
Medication Instructions:  Your physician recommends that you continue on your current medications as directed. Please refer to the Current Medication list given to you today.  *If you need a refill on your cardiac medications before your next appointment, please call your pharmacy*   Lab Work: Your physician recommends that you return for lab work 1 week prior to CT Lab is open Monday-Friday from 8 am-5 pm, No appointment needed  If you have labs (blood work) drawn today and your tests are completely normal, you will receive your results only by: Marland Kitchen MyChart Message (if you have MyChart) OR . A paper copy in the mail If you have any lab test that is abnormal or we need to change your treatment, we will call you to review the results.   Testing/Procedures: Your physician has ordered for you to have a Cardiac CT *Instructions below*   Follow-Up: At [facility], you and your health needs are our priority.  As part of our continuing mission to provide you with exceptional heart care, we have created designated Provider Care Teams.  These Care Teams include your primary Cardiologist (physician) and Advanced Practice Providers (APPs -  Physician Assistants and Nurse Practitioners) who all work together to provide you with the care you need, when you need it.  We recommend signing up for the patient portal called "MyChart".  Sign up information is provided on this After Visit Summary.  MyChart is used to connect with patients for Virtual Visits (Telemedicine).  Patients are able to view lab/test results, encounter notes, upcoming appointments, etc.  Non-urgent messages can be sent to your provider as well.   To learn more about what you can do with MyChart, go to [email or web address].    Your next appointment:   3 month(s)  The format for your next appointment:   In Person  Provider:   Berniece Salines, DO   Other Instructions Your cardiac CT will be scheduled at one of the below  locations:   [facility] [address] [place], [place] [address] [phone]   If scheduled at [facility], please arrive at the [facility] main entrance of [facility] 30 minutes prior to test start time. Proceed to the [facility] Radiology Department (first floor) to check-in and test prep.  Please follow these instructions carefully (unless otherwise directed):  Hold all erectile dysfunction medications at least 3 days (72 hrs) prior to test.  On the Night Before the Test: . Be sure to Drink plenty of water. . Do not consume any caffeinated/decaffeinated beverages or chocolate 12 hours prior to your test. . Do not take any antihistamines 12 hours prior to your test.  On the Day of the Test: . Drink plenty of water. Do not drink any water within one hour of the test. . Do not eat any food 4 hours prior to the test. . You may take your regular medications prior to the test.  . Take metoprolol (Lopressor) two hours prior to test. . HOLD Lisinopril/Hydrochlorothiazide morning of the test.  After the Test: . Drink plenty of water. . After receiving IV contrast, you may experience a mild flushed feeling. This is normal. . On occasion, you may experience a mild rash up to 24 hours after the test. This is not dangerous. If this occurs, you can take Benadryl 25 mg and increase your fluid intake. . If you experience trouble breathing, this can be serious. If it is severe call 911 IMMEDIATELY. If  it is mild, please call our office. . If you take any of these medications: Glipizide/Metformin, Avandament, Glucavance, please do not take 48 hours after completing test unless otherwise instructed.   Once we have confirmed authorization from your insurance company, we will call you to set up a date and time for your test. Based on how quickly your insurance processes prior authorizations requests, please allow up to 4 weeks to be contacted for scheduling your  Cardiac CT appointment. Be advised that routine Cardiac CT appointments could be scheduled as many as 8 weeks after your provider has ordered it.  For non-scheduling related questions, please contact the cardiac imaging nurse navigator should you have any questions/concerns: Marchia Bond, Cardiac Imaging Nurse Navigator Burley Saver, Interim Cardiac Imaging Nurse [place] [facility] Direct Office Dial: [phone]   For scheduling needs, including cancellations and rescheduling, please call Vivien Rota at [phone], option 3.

## 2019-11-13 DIAGNOSIS — R072 Precordial pain: Secondary | ICD-10-CM | POA: Diagnosis not present

## 2019-11-13 DIAGNOSIS — I10 Essential (primary) hypertension: Secondary | ICD-10-CM | POA: Diagnosis not present

## 2019-11-14 LAB — CBC
Hematocrit: 39.6 % (ref 37.5–51.0)
Hemoglobin: 13.7 g/dL (ref 13.0–17.7)
MCH: 30.9 pg (ref 26.6–33.0)
MCHC: 34.6 g/dL (ref 31.5–35.7)
MCV: 89 fL (ref 79–97)
Platelets: 214 x10E3/uL (ref 150–450)
RBC: 4.44 x10E6/uL (ref 4.14–5.80)
RDW: 12.7 % (ref 11.6–15.4)
WBC: 7 x10E3/uL (ref 3.4–10.8)

## 2019-11-14 LAB — BASIC METABOLIC PANEL
BUN/Creatinine Ratio: 18 (ref 10–24)
BUN: 20 mg/dL (ref 8–27)
CO2: 19 mmol/L — ABNORMAL LOW (ref 20–29)
Calcium: 9.5 mg/dL (ref 8.6–10.2)
Chloride: 104 mmol/L (ref 96–106)
Creatinine, Ser: 1.1 mg/dL (ref 0.76–1.27)
GFR calc Af Amer: 74 mL/min/{1.73_m2} (ref >59)
GFR calc non Af Amer: 64 mL/min/{1.73_m2} (ref >59)
Glucose: 168 mg/dL — ABNORMAL HIGH (ref 65–99)
Potassium: 4 mmol/L (ref 3.5–5.2)
Sodium: 139 mmol/L (ref 134–144)

## 2019-11-18 ENCOUNTER — Telehealth (HOSPITAL_COMMUNITY): Payer: Self-pay | Admitting: Emergency Medicine

## 2019-11-18 NOTE — Telephone Encounter (Signed)
Reaching out to patient to offer assistance regarding upcoming cardiac imaging study; pt verbalizes understanding of appt date/time, parking situation and where to check in, pre-test NPO status and medications ordered, and verified current allergies; name and call back number provided for further questions should they arise Adalai Perl RN Navigator Cardiac Imaging [facility] [phone] office [phone] cell 

## 2019-11-20 ENCOUNTER — Ambulatory Visit (HOSPITAL_COMMUNITY)
Admission: RE | Admit: 2019-11-20 | Discharge: 2019-11-20 | Disposition: A | Discharge status: Home / Self Care | Payer: Medicare Other | Source: Ambulatory Visit | Attending: Cardiology | Admitting: Cardiology

## 2019-11-20 DIAGNOSIS — R072 Precordial pain: Secondary | ICD-10-CM | POA: Insufficient documentation

## 2019-11-20 MED ORDER — IOHEXOL 350 MG/ML SOLN
80.0000 mL | Freq: Once | INTRAVENOUS | Status: AC | PRN
  Administered 2019-11-20: 80 mL via INTRAVENOUS

## 2019-11-20 MED ORDER — NITROGLYCERIN 0.4 MG SL SUBL
0.8000 mg | SUBLINGUAL_TABLET | Freq: Once | SUBLINGUAL | Status: AC
  Administered 2019-11-20: 0.8 mg via SUBLINGUAL

## 2019-11-20 MED ORDER — NITROGLYCERIN 0.4 MG SL SUBL
SUBLINGUAL_TABLET | SUBLINGUAL | Status: AC
  Filled 2019-11-20: qty 2

## 2019-11-23 ENCOUNTER — Telehealth: Payer: Self-pay | Admitting: Emergency Medicine

## 2019-11-23 MED ORDER — ROSUVASTATIN CALCIUM 10 MG PO TABS: 10.0000 mg | ORAL_TABLET | Freq: Every day | ORAL | 1 refills | Status: DC

## 2019-11-23 NOTE — Telephone Encounter (Signed)
-----   Message from Berniece Salines, DO sent at 11/20/2019  5:02 PM EDT ----- Have the patient come in sooner than December to his CT results.  In the meantime I like to start him on Crestor 10 mg daily.

## 2019-11-23 NOTE — Telephone Encounter (Signed)
Called and spoke to patients wife per dpr. Advised her that Dr. Harriet Masson would like patient to start crestor 10 mg daily and scheduled him an appointment this week to discuss test results. Patient wife verbally understood. No further questions.

## 2019-11-26 ENCOUNTER — Encounter: Payer: Self-pay | Admitting: Cardiology

## 2019-11-26 ENCOUNTER — Other Ambulatory Visit: Payer: Self-pay

## 2019-11-26 ENCOUNTER — Ambulatory Visit (INDEPENDENT_AMBULATORY_CARE_PROVIDER_SITE_OTHER): Payer: Medicare Other | Admitting: Cardiology

## 2019-11-26 VITALS — BP 128/76 | HR 68 | Ht 73.0 in | Wt 198.8 lb

## 2019-11-26 DIAGNOSIS — I2510 Atherosclerotic heart disease of native coronary artery without angina pectoris: Secondary | ICD-10-CM | POA: Diagnosis not present

## 2019-11-26 DIAGNOSIS — I10 Essential (primary) hypertension: Secondary | ICD-10-CM | POA: Diagnosis not present

## 2019-11-26 DIAGNOSIS — I493 Ventricular premature depolarization: Secondary | ICD-10-CM

## 2019-11-26 DIAGNOSIS — E119 Type 2 diabetes mellitus without complications: Secondary | ICD-10-CM

## 2019-11-26 DIAGNOSIS — E782 Mixed hyperlipidemia: Secondary | ICD-10-CM

## 2019-11-26 MED ORDER — ISOSORBIDE MONONITRATE ER 30 MG PO TB24: 30.0000 mg | ORAL_TABLET | Freq: Every day | ORAL | 3 refills | Status: DC

## 2019-11-26 NOTE — Progress Notes (Signed)
Cardiology Office Note:    Date:  11/26/2019   ID:  Donald Arias, DOB [birth year], MRN [id number]  PCP:  Ronita Hipps, MD  Cardiologist:  Berniece Salines, DO  Electrophysiologist:  None   Referring MD: Ronita Hipps, MD   Follow-up visit  History of Present Illness:    Donald Arias is a 79 y.o. 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  I saw the patient on October 29, 2019 due to increasing chest pain I sent the patient for coronary CTA despite his negative initial pharmacologic stress test.  He is here today to discuss his testing results.  He tells me that he still does have intermittent chest pain.  Shortness of breath is improving.  Past Medical History:  Diagnosis Date  . Chest pain of uncertain etiology 07/30/4032  . Depressed left ventricular ejection fraction 09/28/2019  . Diabetes mellitus with neuropathy ([place])   . Dilatation of aorta ([place]) 10/29/2019  . Hiatal hernia 03/14/2013   Found during EGD procedure  . Hypertension   . Hypertriglyceridemia   . Migraine-cluster headache syndrome   . Precordial pain 10/29/2019  . PVC (premature ventricular contraction) 09/28/2019  . Thyroid nodule   . Type 2 diabetes mellitus without complication, without long-term current use of insulin ([place]) 09/28/2019    Past Surgical History:  Procedure Laterality Date  . ESOPHAGEAL DILATION  06/2000   Mild esophageal stricture dilated, Dr. Lyda Jester  . POLYPECTOMY  06/29/2003   Diminutive Hyperplastic polyps  . PROSTATE BIOPSY     For elevated PSA    Current Medications: Current Meds  Medication Sig  . aspirin EC 81 MG tablet Take 81 mg by mouth daily. Swallow whole.   . cholecalciferol (VITAMIN D3) 25 MCG (1000 UNIT) tablet Take 1,000 Units by mouth daily.   Marland Kitchen CINNAMON PO Take 1 capsule by mouth daily.  . Coenzyme Q10 100 MG TABS  Take 1 tablet by mouth daily.  Marland Kitchen lisinopril-hydrochlorothiazide (ZESTORETIC) 10-12.5 MG tablet Take 1 tablet by mouth daily.  . Multiple Vitamins-Minerals (CENTRUM SILVER PO) Take 1 tablet by mouth daily.  . nitroGLYCERIN (NITROSTAT) 0.4 MG SL tablet Place 1 tablet (0.4 mg total) under the tongue every 5 (five) minutes as needed for chest pain.  . Omega-3 Fatty Acids (OMEGA 3 500 PO) Take 1 capsule by mouth daily.  . rosuvastatin (CRESTOR) 10 MG tablet Take 1 tablet (10 mg total) by mouth daily.  . sitaGLIPtin (JANUVIA) 100 MG tablet Take 100 mg by mouth daily.  . TURMERIC PO Take 1 tablet by mouth daily.  . verapamil (CALAN-SR) 240 MG CR tablet Take 240 mg by mouth at bedtime.     Allergies:   Patient has no known allergies.   Social History   Socioeconomic History  . Marital status: Married    Spouse name: Not on file  . Number of children: Not on file  . Years of education: Not on file  . Highest education level: Not on file  Occupational History  . Not on file  Tobacco Use  . Smoking status: Former Smoker    Types: Cigarettes  . Smokeless tobacco: Never Used  Substance and Sexual Activity  . Alcohol use: Yes    Comment: Bottle of wine per week  . Drug use: Not on file  . Sexual activity: Not on file  Other Topics Concern  . Not on  file  Social History Narrative  . Not on file   Social Determinants of Health   Financial Resource Strain:   . Difficulty of Paying Living Expenses: Not on file  Food Insecurity:   . Worried About Charity fundraiser in the Last Year: Not on file  . Ran Out of Food in the Last Year: Not on file  Transportation Needs:   . Lack of Transportation (Medical): Not on file  . Lack of Transportation (Non-Medical): Not on file  Physical Activity:   . Days of Exercise per Week: Not on file  . Minutes of Exercise per Session: Not on file  Stress:   . Feeling of Stress : Not on file  Social Connections:   . Frequency of Communication with  Friends and Family: Not on file  . Frequency of Social Gatherings with Friends and Family: Not on file  . Attends Religious Services: Not on file  . Active Member of Clubs or Organizations: Not on file  . Attends Archivist Meetings: Not on file  . Marital Status: Not on file     Family History: The patient's family history includes Lung cancer in his father.  ROS:   Review of Systems  Constitution: Negative for decreased appetite, fever and weight gain.  HENT: Negative for congestion, ear discharge, hoarse voice and sore throat.   Eyes: Negative for discharge, redness, vision loss in right eye and visual halos.  Cardiovascular: Negative for chest pain, dyspnea on exertion, leg swelling, orthopnea and palpitations.  Respiratory: Negative for cough, hemoptysis, shortness of breath and snoring.   Endocrine: Negative for heat intolerance and polyphagia.  Hematologic/Lymphatic: Negative for bleeding problem. Does not bruise/bleed easily.  Skin: Negative for flushing, nail changes, rash and suspicious lesions.  Musculoskeletal: Negative for arthritis, joint pain, muscle cramps, myalgias, neck pain and stiffness.  Gastrointestinal: Negative for abdominal pain, bowel incontinence, diarrhea and excessive appetite.  Genitourinary: Negative for decreased libido, genital sores and incomplete emptying.  Neurological: Negative for brief paralysis, focal weakness, headaches and loss of balance.  Psychiatric/Behavioral: Negative for altered mental status, depression and suicidal ideas.  Allergic/Immunologic: Negative for HIV exposure and persistent infections.    EKGs/Labs/Other Studies Reviewed:    The following studies were reviewed today:   EKG: None today  Echo IMPRESSIONS  10/15/2019 1. GLS -11.4%. Left ventricular ejection fraction, by estimation, is 50 to 55%. The left ventricle has low normal function. The left ventricle has  no regional wall motion abnormalities. There is  mild left ventricular  hypertrophy. Left ventricular  diastolic parameters are consistent with Grade I diastolic dysfunction  (impaired relaxation).  2. Right ventricular systolic function is normal. The right ventricular  size is normal. There is mildly elevated pulmonary artery systolic  pressure.  3. The mitral valve is normal in structure. No evidence of mitral valve  regurgitation. No evidence of mitral stenosis.  4. The aortic valve is normal in structure. Aortic valve regurgitation is  not visualized. No aortic stenosis is present.  5. There is mild to moderate dilatation of the ascending aorta measuring  40 mm.  6. The inferior vena cava is normal in size with greater than 50%  respiratory variability, suggesting right atrial pressure of 3 mmHg.   CCTA FINDINGS:  Image quality: excellent.  Noise artifact is: Limited.  Coronary Arteries:  Normal coronary origin.  Right dominance.  Left main: The left main is a large caliber vessel with a normal  take off from the left  coronary cusp that trifurcates into a LAD, LCX, and ramus intermedius. There is no plaque or stenosis.  Left anterior descending artery: The proximal LAD contains mild mixed density plaque (25-49%). The mid and distal LAD segments contain minimal noncalcified plaque (<25%). The LAD gives off 2 patent diagonal branches.  Ramus intermedius: Small vessel that is patent with no evidence of plaque or stenosis.  Left circumflex artery: The LCX is non-dominant. The proximal LCX contains minimal calcified plaque (<25%). The mid LCX contains minimal non-calcified plaque (<25%). The LCX gives off 2 patent obtuse marginal branches.  Right coronary artery: The RCA is dominant with normal take off from the right coronary cusp. The proximal RCA contains mild non-calcified plaque (25-49%). The mid and distal RCA contains minimal calcified plaque (<25%). The RCA terminates as a PDA and right posterolateral  branch without evidence of plaque or stenosis.  Right Atrium: Right atrial size is within normal limits.  Right Ventricle: The right ventricular cavity is within normal limits.  Left Atrium: Left atrial size is normal in size with no left atrial appendage filling defect. A small PFO is present.  Left Ventricle: The ventricular cavity size is within normal limits. There are no stigmata of prior infarction. There is no abnormal filling defect.  Pulmonary arteries: Normal in size without proximal filling defect.  Pulmonary veins: Normal pulmonary venous drainage.  Pericardium: Normal thickness with no significant effusion or calcium present.  Cardiac valves: The aortic valve is trileaflet without significant calcification. The mitral valve is normal structure without significant calcification.  Aorta: Normal caliber (root up to 38 mm; ascending aorta up to 37 mm) with no significant disease.  Extra-cardiac findings: See attached radiology report for non-cardiac structures.  IMPRESSION: 1. Coronary calcium score of 186. This was 40th percentile for age and sex matched controls.  2. Normal coronary origin with right dominance.  3. Mild CAD in the proximal LAD and proximal RCA (25-49%).  4. Minimal CAD in the LCX (<25%).  5. A small PFO is present.  RECOMMENDATIONS: 1. Mild non-obstructive CAD (25-49%). Consider non-atherosclerotic causes of chest pain. Consider preventive therapy and risk factor modification.  Recent Labs: 11/13/2019: BUN 20; Creatinine, Ser 1.10; Hemoglobin 13.7; Platelets 214; Potassium 4.0; Sodium 139  Recent Lipid Panel No results found for: CHOL, TRIG, HDL, CHOLHDL, VLDL, LDLCALC, LDLDIRECT  Physical Exam:    VS:  BP 128/76   Pulse 68   Ht 6\' 1"  (1.854 m)   Wt 198 lb 12.8 oz (90.2 kg)   SpO2 96%   BMI 26.23 kg/m     Wt Readings from Last 3 Encounters:  11/26/19 198 lb 12.8 oz (90.2 kg)  10/29/19 202 lb (91.6 kg)    09/28/19 201 lb 6.4 oz (91.4 kg)     GEN: Well nourished, well developed in no acute distress HEENT: Normal NECK: No JVD; No carotid bruits LYMPHATICS: No lymphadenopathy CARDIAC: S1S2 noted,RRR, no murmurs, rubs, gallops RESPIRATORY:  Clear to auscultation without rales, wheezing or rhonchi  ABDOMEN: Soft, non-tender, non-distended, +bowel sounds, no guarding. EXTREMITIES: No edema, No cyanosis, no clubbing MUSCULOSKELETAL:  No deformity  SKIN: Warm and dry NEUROLOGIC:  Alert and oriented x 3, non-focal PSYCHIATRIC:  Normal affect, good insight  ASSESSMENT:    1. Coronary artery disease involving native heart, angina presence unspecified, unspecified vessel or lesion type   2. Essential hypertension   3. Mixed hyperlipidemia   4. PVC (premature ventricular contraction)   5. Type 2 diabetes mellitus without complication, without long-term  current use of insulin ([facility])    PLAN:     1.  I was able to speak with the patient and his wife.  We discussed this testing result.  He understand that he does have mild coronary artery disease.  I plan to use antianginals to hopefully help with his chest discomfort.  I am going to add Imdur 30 mg daily to his regimen.  He is already on atorvastatin 10 mg daily.  Along with aspirin 81 mg daily.  I did educate the patient also incidental finding of PFO.  I explained to him what this meant. All of their questions has been answered   2.  His blood pressure susceptible today in the office no changes will be made.  3.  Diabetes mellitus continue with management per his PCP.  4.  PVC-continue patient on his verapamil. The patient is in agreement with the above plan. The patient left the office in stable condition.  The patient will follow up in   Medication Adjustments/Labs and Tests Ordered: Current medicines are reviewed at length with the patient today.  Concerns regarding medicines are outlined above.  No orders of the defined types were  placed in this encounter.  Meds ordered this encounter  Medications  . isosorbide mononitrate (IMDUR) 30 MG 24 hr tablet    Sig: Take 1 tablet (30 mg total) by mouth daily.    Dispense:  90 tablet    Refill:  3    Patient Instructions  Medication Instructions:  Start Isosorbide (Imdur) 30 mg daily   *If you need a refill on your cardiac medications before your next appointment, please call your pharmacy*   Lab Work: None ordered   If you have labs (blood work) drawn today and your tests are completely normal, you will receive your results only by: Marland Kitchen MyChart Message (if you have MyChart) OR . A paper copy in the mail If you have any lab test that is abnormal or we need to change your treatment, we will call you to review the results.   Testing/Procedures: None ordered    Follow-Up: At [facility], you and your health needs are our priority.  As part of our continuing mission to provide you with exceptional heart care, we have created designated Provider Care Teams.  These Care Teams include your primary Cardiologist (physician) and Advanced Practice Providers (APPs -  Physician Assistants and Nurse Practitioners) who all work together to provide you with the care you need, when you need it.  We recommend signing up for the patient portal called "MyChart".  Sign up information is provided on this After Visit Summary.  MyChart is used to connect with patients for Virtual Visits (Telemedicine).  Patients are able to view lab/test results, encounter notes, upcoming appointments, etc.  Non-urgent messages can be sent to your provider as well.   To learn more about what you can do with MyChart, go to [email or web address].    Your next appointment:   3 month(s)  The format for your next appointment:   In Person  Provider:   Berniece Salines, DO   Other Instructions None      Adopting a Healthy Lifestyle.  Know what a healthy weight is for you (roughly BMI <25) and aim to  maintain this   Aim for 7+ servings of fruits and vegetables daily   65-80+ fluid ounces of water or unsweet tea for healthy kidneys   Limit to max 1 drink of alcohol per day; avoid  smoking/tobacco   Limit animal fats in diet for cholesterol and heart health - choose grass fed whenever available   Avoid highly processed foods, and foods high in saturated/trans fats   Aim for low stress - take time to unwind and care for your mental health   Aim for 150 min of moderate intensity exercise weekly for heart health, and weights twice weekly for bone health   Aim for 7-9 hours of sleep daily   When it comes to diets, agreement about the perfect plan isnt easy to find, even among the experts. Experts at the [employer] developed an idea known as the Healthy Eating Plate. Just imagine a plate divided into logical, healthy portions.   The emphasis is on diet quality:   Load up on vegetables and fruits - one-half of your plate: Aim for color and variety, and remember that potatoes dont count.   Go for whole grains - one-quarter of your plate: Whole wheat, barley, wheat berries, quinoa, oats, brown rice, and foods made with them. If you want pasta, go with whole wheat pasta.   Protein power - one-quarter of your plate: Fish, chicken, beans, and nuts are all healthy, versatile protein sources. Limit red meat.   The diet, however, does go beyond the plate, offering a few other suggestions.   Use healthy plant oils, such as olive, canola, soy, corn, sunflower and peanut. Check the labels, and avoid partially hydrogenated oil, which have unhealthy trans fats.   If youre thirsty, drink water. Coffee and tea are good in moderation, but skip sugary drinks and limit milk and dairy products to one or two daily servings.   The type of carbohydrate in the diet is more important than the amount. Some sources of carbohydrates, such as vegetables, fruits, whole grains, and beans-are  healthier than others.   Finally, stay active  Signed, Berniece Salines, DO  11/26/2019 9:27 AM    [facility]

## 2019-11-26 NOTE — Patient Instructions (Signed)
Medication Instructions:  Start Isosorbide (Imdur) 30 mg daily   *If you need a refill on your cardiac medications before your next appointment, please call your pharmacy*   Lab Work: None ordered   If you have labs (blood work) drawn today and your tests are completely normal, you will receive your results only by: Marland Kitchen MyChart Message (if you have MyChart) OR . A paper copy in the mail If you have any lab test that is abnormal or we need to change your treatment, we will call you to review the results.   Testing/Procedures: None ordered    Follow-Up: At [facility], you and your health needs are our priority.  As part of our continuing mission to provide you with exceptional heart care, we have created designated Provider Care Teams.  These Care Teams include your primary Cardiologist (physician) and Advanced Practice Providers (APPs -  Physician Assistants and Nurse Practitioners) who all work together to provide you with the care you need, when you need it.  We recommend signing up for the patient portal called "MyChart".  Sign up information is provided on this After Visit Summary.  MyChart is used to connect with patients for Virtual Visits (Telemedicine).  Patients are able to view lab/test results, encounter notes, upcoming appointments, etc.  Non-urgent messages can be sent to your provider as well.   To learn more about what you can do with MyChart, go to [email or web address].    Your next appointment:   3 month(s)  The format for your next appointment:   In Person  Provider:   Berniece Salines, DO   Other Instructions None

## 2019-12-08 DIAGNOSIS — Z23 Encounter for immunization: Secondary | ICD-10-CM | POA: Diagnosis not present

## 2019-12-17 ENCOUNTER — Telehealth: Payer: Self-pay | Admitting: Cardiology

## 2019-12-17 NOTE — Telephone Encounter (Signed)
Spoke to the patient just now and let him know that he could hold this medication until he is seen in the office in December. He verbalizes understanding and thanks me for the call back.

## 2019-12-17 NOTE — Telephone Encounter (Signed)
That is fine he can hold that medication to have see him in follow-up we will discuss options at that time.

## 2019-12-17 NOTE — Telephone Encounter (Signed)
Pt c/o medication issue:  1. Name of Medication:   isosorbide mononitrate   2. How are you currently taking this medication (dosage and times per day)?   One time per day  3. Are you having a reaction (difficulty breathing--STAT)?  Horrible headache  4. What is your medication issue?   Causing awful headache no matter what time of day he tries to take it. Patient's wife calling in to find out what can be done because he no longer wants to take this medication.

## 2019-12-25 ENCOUNTER — Encounter: Payer: Self-pay | Admitting: Cardiology

## 2020-01-29 DIAGNOSIS — E1140 Type 2 diabetes mellitus with diabetic neuropathy, unspecified: Secondary | ICD-10-CM | POA: Diagnosis not present

## 2020-02-02 ENCOUNTER — Ambulatory Visit: Payer: Medicare Other | Admitting: Cardiology

## 2020-02-02 DIAGNOSIS — E1165 Type 2 diabetes mellitus with hyperglycemia: Secondary | ICD-10-CM | POA: Diagnosis not present

## 2020-02-02 DIAGNOSIS — Z6826 Body mass index (BMI) 26.0-26.9, adult: Secondary | ICD-10-CM | POA: Diagnosis not present

## 2020-02-02 DIAGNOSIS — Z1331 Encounter for screening for depression: Secondary | ICD-10-CM | POA: Diagnosis not present

## 2020-02-02 DIAGNOSIS — Z0000 Encounter for general adult medical examination without abnormal findings: Secondary | ICD-10-CM | POA: Diagnosis not present

## 2020-02-04 ENCOUNTER — Other Ambulatory Visit: Payer: Self-pay

## 2020-02-04 ENCOUNTER — Ambulatory Visit (INDEPENDENT_AMBULATORY_CARE_PROVIDER_SITE_OTHER): Payer: Medicare Other | Admitting: Cardiology

## 2020-02-04 ENCOUNTER — Encounter: Payer: Self-pay | Admitting: Cardiology

## 2020-02-04 VITALS — BP 132/70 | HR 67 | Ht 73.0 in | Wt 199.0 lb

## 2020-02-04 DIAGNOSIS — I10 Essential (primary) hypertension: Secondary | ICD-10-CM | POA: Diagnosis not present

## 2020-02-04 DIAGNOSIS — I2510 Atherosclerotic heart disease of native coronary artery without angina pectoris: Secondary | ICD-10-CM | POA: Insufficient documentation

## 2020-02-04 DIAGNOSIS — E119 Type 2 diabetes mellitus without complications: Secondary | ICD-10-CM

## 2020-02-04 DIAGNOSIS — I493 Ventricular premature depolarization: Secondary | ICD-10-CM | POA: Diagnosis not present

## 2020-02-04 NOTE — Patient Instructions (Signed)
Medication Instructions:  Your physician recommends that you continue on your current medications as directed. Please refer to the Current Medication list given to you today.  *If you need a refill on your cardiac medications before your next appointment, please call your pharmacy*   Lab Work: None. If you have labs (blood work) drawn today and your tests are completely normal, you will receive your results only by: Marland Kitchen MyChart Message (if you have MyChart) OR . A paper copy in the mail If you have any lab test that is abnormal or we need to change your treatment, we will call you to review the results.   Testing/Procedures: none   Follow-Up: At [facility], you and your health needs are our priority.  As part of our continuing mission to provide you with exceptional heart care, we have created designated Provider Care Teams.  These Care Teams include your primary Cardiologist (physician) and Advanced Practice Providers (APPs -  Physician Assistants and Nurse Practitioners) who all work together to provide you with the care you need, when you need it.  We recommend signing up for the patient portal called "MyChart".  Sign up information is provided on this After Visit Summary.  MyChart is used to connect with patients for Virtual Visits (Telemedicine).  Patients are able to view lab/test results, encounter notes, upcoming appointments, etc.  Non-urgent messages can be sent to your provider as well.   To learn more about what you can do with MyChart, go to [email or web address].    Your next appointment:   6 month(s)  The format for your next appointment:   In Person  Provider:   Berniece Salines, DO   Other Instructions

## 2020-02-04 NOTE — Progress Notes (Signed)
Cardiology Office Note:    Date:  02/04/2020   ID:  Donald Arias, DOB [birth year], MRN [id number]  PCP:  Ronita Hipps, MD  Cardiologist:  Berniece Salines, DO  Electrophysiologist:  None   Referring MD: Ronita Hipps, MD   Chief Complaint  Patient presents with  . Follow-up    History of Present Illness:    Donald Arias is a 79 y.o. 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  The patient is here today for follow-up visit. He denies any chest pain or shortness of breath. He tells me he is doing well from a cardiovascular standpoint. He recently had blood work with his PCP and he was happy with the result and even got hi-fi from his PCP.   Past Medical History:  Diagnosis Date  . Chest pain of uncertain etiology 08/29/1285  . Depressed left ventricular ejection fraction 09/28/2019  . Diabetes mellitus with neuropathy ([place])   . Dilatation of aorta ([place]) 10/29/2019  . Hiatal hernia 03/14/2013   Found during EGD procedure  . Hypertension   . Hypertriglyceridemia   . Migraine-cluster headache syndrome   . Precordial pain 10/29/2019  . PVC (premature ventricular contraction) 09/28/2019  . Thyroid nodule   . Type 2 diabetes mellitus without complication, without long-term current use of insulin ([place]) 09/28/2019    Past Surgical History:  Procedure Laterality Date  . ESOPHAGEAL DILATION  06/2000   Mild esophageal stricture dilated, Dr. Lyda Jester  . POLYPECTOMY  06/29/2003   Diminutive Hyperplastic polyps  . PROSTATE BIOPSY     For elevated PSA    Current Medications: Current Meds  Medication Sig  . aspirin EC 81 MG tablet Take 81 mg by mouth daily. Swallow whole.   . cholecalciferol (VITAMIN D3) 25 MCG (1000 UNIT) tablet Take 1,000 Units by mouth daily.   Marland Kitchen CINNAMON PO Take 1 capsule by mouth daily.  . Coenzyme Q10 100 MG TABS  Take 1 tablet by mouth daily.  Marland Kitchen lisinopril-hydrochlorothiazide (ZESTORETIC) 10-12.5 MG tablet Take 1 tablet by mouth daily.  . Multiple Vitamins-Minerals (CENTRUM SILVER PO) Take 1 tablet by mouth daily.  . nitroGLYCERIN (NITROSTAT) 0.4 MG SL tablet Place 1 tablet (0.4 mg total) under the tongue every 5 (five) minutes as needed for chest pain.  . Omega-3 Fatty Acids (OMEGA 3 500 PO) Take 1 capsule by mouth daily.  . rosuvastatin (CRESTOR) 10 MG tablet Take 1 tablet (10 mg total) by mouth daily.  . sitaGLIPtin (JANUVIA) 100 MG tablet Take 100 mg by mouth daily.  . TURMERIC PO Take 1 tablet by mouth daily.  . verapamil (CALAN-SR) 240 MG CR tablet Take 240 mg by mouth at bedtime.     Allergies:   Isosorbide   Social History   Socioeconomic History  . Marital status: Married    Spouse name: Not on file  . Number of children: Not on file  . Years of education: Not on file  . Highest education level: Not on file  Occupational History  . Not on file  Tobacco Use  . Smoking status: Former Smoker    Types: Cigarettes  . Smokeless tobacco: Never Used  Substance and Sexual Activity  . Alcohol use: Yes    Comment: Bottle of wine per week  . Drug use: Not on file  . Sexual activity: Not on file  Other Topics Concern  .  Not on file  Social History Narrative  . Not on file   Social Determinants of Health   Financial Resource Strain: Not on file  Food Insecurity: Not on file  Transportation Needs: Not on file  Physical Activity: Not on file  Stress: Not on file  Social Connections: Not on file     Family History: The patient's family history includes Lung cancer in his father.  ROS:   Review of Systems  Constitution: Negative for decreased appetite, fever and weight gain.  HENT: Negative for congestion, ear discharge, hoarse voice and sore throat.   Eyes: Negative for discharge, redness, vision loss in right eye and visual halos.  Cardiovascular: Negative for chest pain,  dyspnea on exertion, leg swelling, orthopnea and palpitations.  Respiratory: Negative for cough, hemoptysis, shortness of breath and snoring.   Endocrine: Negative for heat intolerance and polyphagia.  Hematologic/Lymphatic: Negative for bleeding problem. Does not bruise/bleed easily.  Skin: Negative for flushing, nail changes, rash and suspicious lesions.  Musculoskeletal: Negative for arthritis, joint pain, muscle cramps, myalgias, neck pain and stiffness.  Gastrointestinal: Negative for abdominal pain, bowel incontinence, diarrhea and excessive appetite.  Genitourinary: Negative for decreased libido, genital sores and incomplete emptying.  Neurological: Negative for brief paralysis, focal weakness, headaches and loss of balance.  Psychiatric/Behavioral: Negative for altered mental status, depression and suicidal ideas.  Allergic/Immunologic: Negative for HIV exposure and persistent infections.    EKGs/Labs/Other Studies Reviewed:    The following studies were reviewed today:   EKG:  The ekg ordered today demonstrates   EchoIMPRESSIONS8/19/2021 1. GLS -11.4%. Left ventricular ejection fraction, by estimation, is 50 to 55%. The left ventricle has low normal function. The left ventricle has  no regional wall motion abnormalities. There is mild left ventricular  hypertrophy. Left ventricular  diastolic parameters are consistent with Grade I diastolic dysfunction  (impaired relaxation).  2. Right ventricular systolic function is normal. The right ventricular  size is normal. There is mildly elevated pulmonary artery systolic  pressure.  3. The mitral valve is normal in structure. No evidence of mitral valve  regurgitation. No evidence of mitral stenosis.  4. The aortic valve is normal in structure. Aortic valve regurgitation is  not visualized. No aortic stenosis is present.  5. There is mild to moderate dilatation of the ascending aorta measuring  40 mm.  6. The inferior  vena cava is normal in size with greater than 50%  respiratory variability, suggesting right atrial pressure of 3 mmHg.   CCTA FINDINGS:  Image quality: excellent.  Noise artifact is: Limited.  Coronary Arteries: Normal coronary origin. Right dominance.  Left main: The left main is a large caliber vessel with a normal  take off from the left coronary cusp that trifurcates into a LAD, LCX, and ramus intermedius. There is no plaque or stenosis.  Left anterior descending artery: The proximal LAD contains mild mixed density plaque (25-49%). The mid and distal LAD segments contain minimal noncalcified plaque (<25%). The LAD gives off 2 patent diagonal branches.  Ramus intermedius: Small vessel that is patent with no evidence of plaque or stenosis.  Left circumflex artery: The LCX is non-dominant. The proximal LCX contains minimal calcified plaque (<25%). The mid LCX contains minimal non-calcified plaque (<25%). The LCX gives off 2 patent obtuse marginal branches.  Right coronary artery: The RCA is dominant with normal take off from the right coronary cusp. The proximal RCA contains mild non-calcified plaque (25-49%). The mid and distal RCA contains minimal calcified plaque (<  25%). The RCA terminates as a PDA and right posterolateral branch without evidence of plaque or stenosis.  Right Atrium: Right atrial size is within normal limits.  Right Ventricle: The right ventricular cavity is within normal limits.  Left Atrium: Left atrial size is normal in size with no left atrial appendage filling defect. A small PFO is present.  Left Ventricle: The ventricular cavity size is within normal limits. There are no stigmata of prior infarction. There is no abnormal filling defect.  Pulmonary arteries: Normal in size without proximal filling defect.  Pulmonary veins: Normal pulmonary venous drainage.  Pericardium: Normal thickness with no significant effusion  or calcium present.  Cardiac valves: The aortic valve is trileaflet without significant calcification. The mitral valve is normal structure without significant calcification.  Aorta: Normal caliber (root up to 38 mm; ascending aorta up to 37 mm) with no significant disease.  Extra-cardiac findings: See attached radiology report for non-cardiac structures.  IMPRESSION: 1. Coronary calcium score of 186. This was 40th percentile for age and sex matched controls.  2. Normal coronary origin with right dominance.  3. Mild CAD in the proximal LAD and proximal RCA (25-49%).  4. Minimal CAD in the LCX (<25%).  5. A small PFO is present.  RECOMMENDATIONS: Mild non-obstructive CAD (25-49%). Consider non-atherosclerotic causes of chest pain. Consider preventive therapy and risk factor modification.   Recent Labs: 11/13/2019: BUN 20; Creatinine, Ser 1.10; Hemoglobin 13.7; Platelets 214; Potassium 4.0; Sodium 139  Recent Lipid Panel No results found for: CHOL, TRIG, HDL, CHOLHDL, VLDL, LDLCALC, LDLDIRECT  Physical Exam:    VS:  BP 132/70 (BP Location: Left Arm, Patient Position: Sitting)   Pulse 67   Ht 6\' 1"  (1.854 m)   Wt 199 lb (90.3 kg)   SpO2 99%   BMI 26.25 kg/m     Wt Readings from Last 3 Encounters:  02/04/20 199 lb (90.3 kg)  11/26/19 198 lb 12.8 oz (90.2 kg)  10/29/19 202 lb (91.6 kg)     GEN: Well nourished, well developed in no acute distress HEENT: Normal NECK: No JVD; No carotid bruits LYMPHATICS: No lymphadenopathy CARDIAC: S1S2 noted,RRR, no murmurs, rubs, gallops RESPIRATORY:  Clear to auscultation without rales, wheezing or rhonchi  ABDOMEN: Soft, non-tender, non-distended, +bowel sounds, no guarding. EXTREMITIES: No edema, No cyanosis, no clubbing MUSCULOSKELETAL:  No deformity  SKIN: Warm and dry NEUROLOGIC:  Alert and oriented x 3, non-focal PSYCHIATRIC:  Normal affect, good insight  ASSESSMENT:    1. Primary hypertension   2. PVC  (premature ventricular contraction)   3. Type 2 diabetes mellitus without complication, without long-term current use of insulin ([place])   4. Mild CAD    PLAN:     I will continue his current medication regimen. We will hold off on the Imdur or any anticoagulants for now given the fact that the patient feels a lot better. I have asked him to call the office if he experience any chest pain or shortness of breath. He also does have as needed nitroglycerin.  The patient is in agreement with the above plan. The patient left the office in stable condition.  The patient will follow up in 6 months or sooner if needed.   Medication Adjustments/Labs and Tests Ordered: Current medicines are reviewed at length with the patient today.  Concerns regarding medicines are outlined above.  No orders of the defined types were placed in this encounter.  No orders of the defined types were placed in this encounter.  There are no Patient Instructions on file for this visit.   Adopting a Healthy Lifestyle.  Know what a healthy weight is for you (roughly BMI <25) and aim to maintain this   Aim for 7+ servings of fruits and vegetables daily   65-80+ fluid ounces of water or unsweet tea for healthy kidneys   Limit to max 1 drink of alcohol per day; avoid smoking/tobacco   Limit animal fats in diet for cholesterol and heart health - choose grass fed whenever available   Avoid highly processed foods, and foods high in saturated/trans fats   Aim for low stress - take time to unwind and care for your mental health   Aim for 150 min of moderate intensity exercise weekly for heart health, and weights twice weekly for bone health   Aim for 7-9 hours of sleep daily   When it comes to diets, agreement about the perfect plan isnt easy to find, even among the experts. Experts at the [employer] developed an idea known as the Healthy Eating Plate. Just imagine a plate divided into logical,  healthy portions.   The emphasis is on diet quality:   Load up on vegetables and fruits - one-half of your plate: Aim for color and variety, and remember that potatoes dont count.   Go for whole grains - one-quarter of your plate: Whole wheat, barley, wheat berries, quinoa, oats, brown rice, and foods made with them. If you want pasta, go with whole wheat pasta.   Protein power - one-quarter of your plate: Fish, chicken, beans, and nuts are all healthy, versatile protein sources. Limit red meat.   The diet, however, does go beyond the plate, offering a few other suggestions.   Use healthy plant oils, such as olive, canola, soy, corn, sunflower and peanut. Check the labels, and avoid partially hydrogenated oil, which have unhealthy trans fats.   If youre thirsty, drink water. Coffee and tea are good in moderation, but skip sugary drinks and limit milk and dairy products to one or two daily servings.   The type of carbohydrate in the diet is more important than the amount. Some sources of carbohydrates, such as vegetables, fruits, whole grains, and beans-are healthier than others.   Finally, stay active  Signed, Berniece Salines, DO  02/04/2020 2:23 PM    [place]

## 2020-03-01 ENCOUNTER — Other Ambulatory Visit: Payer: Self-pay | Admitting: Cardiology

## 2020-08-01 DIAGNOSIS — E781 Pure hyperglyceridemia: Secondary | ICD-10-CM | POA: Diagnosis not present

## 2020-08-01 DIAGNOSIS — E1165 Type 2 diabetes mellitus with hyperglycemia: Secondary | ICD-10-CM | POA: Diagnosis not present

## 2020-08-04 DIAGNOSIS — Z6826 Body mass index (BMI) 26.0-26.9, adult: Secondary | ICD-10-CM | POA: Diagnosis not present

## 2020-08-04 DIAGNOSIS — I10 Essential (primary) hypertension: Secondary | ICD-10-CM | POA: Diagnosis not present

## 2020-08-04 DIAGNOSIS — E1140 Type 2 diabetes mellitus with diabetic neuropathy, unspecified: Secondary | ICD-10-CM | POA: Diagnosis not present

## 2020-08-09 ENCOUNTER — Encounter: Payer: Self-pay | Admitting: Cardiology

## 2020-08-09 ENCOUNTER — Other Ambulatory Visit: Payer: Self-pay

## 2020-08-09 ENCOUNTER — Ambulatory Visit: Payer: PPO | Admitting: Cardiology

## 2020-08-09 VITALS — BP 132/68 | HR 85 | Ht 73.0 in | Wt 197.2 lb

## 2020-08-09 DIAGNOSIS — E119 Type 2 diabetes mellitus without complications: Secondary | ICD-10-CM | POA: Diagnosis not present

## 2020-08-09 DIAGNOSIS — I10 Essential (primary) hypertension: Secondary | ICD-10-CM | POA: Diagnosis not present

## 2020-08-09 DIAGNOSIS — E781 Pure hyperglyceridemia: Secondary | ICD-10-CM | POA: Diagnosis not present

## 2020-08-09 DIAGNOSIS — I493 Ventricular premature depolarization: Secondary | ICD-10-CM | POA: Diagnosis not present

## 2020-08-09 DIAGNOSIS — I2510 Atherosclerotic heart disease of native coronary artery without angina pectoris: Secondary | ICD-10-CM | POA: Diagnosis not present

## 2020-08-09 NOTE — Progress Notes (Signed)
Cardiology Office Note:    Date:  08/09/2020   ID:  Donald Arias, DOB [birth year], MRN [id number]  PCP:  Ronita Hipps, MD  Cardiologist:  Berniece Salines, DO  Electrophysiologist:  None   Referring MD: Ronita Hipps, MD   No chief complaint on file.   History of Present Illness:    Donald Arias is a 80 y.o. 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  I saw the patient December 2021 at that time he appeared to be doing well from a clinical standpoint.  No changes were made to his medication regimen.  He is here today for follow-up visit.  Today he offers no complaints.  He tells me has been doing well from a cardiovascular standpoint   Past Medical History:  Diagnosis Date   Chest pain of uncertain etiology 08/31/1605   Depressed left ventricular ejection fraction 09/28/2019   Diabetes mellitus with neuropathy (HCC)    Dilatation of aorta ([place]) 10/29/2019   Hiatal hernia 03/14/2013   Found during EGD procedure   Hypertension    Hypertriglyceridemia    Migraine-cluster headache syndrome    Precordial pain 10/29/2019   PVC (premature ventricular contraction) 09/28/2019   Thyroid nodule    Type 2 diabetes mellitus without complication, without long-term current use of insulin ([place]) 09/28/2019    Past Surgical History:  Procedure Laterality Date   ESOPHAGEAL DILATION  06/2000   Mild esophageal stricture dilated, Dr. Lyda Jester   POLYPECTOMY  06/29/2003   Diminutive Hyperplastic polyps   PROSTATE BIOPSY     For elevated PSA    Current Medications: Current Meds  Medication Sig   aspirin EC 81 MG tablet Take 81 mg by mouth daily. Swallow whole.    cholecalciferol (VITAMIN D3) 25 MCG (1000 UNIT) tablet Take 1,000 Units by mouth daily.    CINNAMON PO Take 1 capsule by mouth daily.   Coenzyme Q10 100 MG TABS Take 1 tablet by  mouth daily.   lisinopril-hydrochlorothiazide (ZESTORETIC) 10-12.5 MG tablet Take 1 tablet by mouth daily.   Multiple Vitamins-Minerals (CENTRUM SILVER PO) Take 1 tablet by mouth daily.   nitroGLYCERIN (NITROSTAT) 0.4 MG SL tablet Place 1 tablet (0.4 mg total) under the tongue every 5 (five) minutes as needed for chest pain.   Omega-3 Fatty Acids (OMEGA 3 500 PO) Take 1 capsule by mouth daily.   rosuvastatin (CRESTOR) 10 MG tablet TAKE ONE TABLET BY MOUTH EVERY DAY   sitaGLIPtin (JANUVIA) 100 MG tablet Take 100 mg by mouth daily.   verapamil (CALAN-SR) 240 MG CR tablet Take 240 mg by mouth at bedtime.   [DISCONTINUED] TURMERIC PO Take 1 tablet by mouth daily.     Allergies:   Isosorbide   Social History   Socioeconomic History   Marital status: Married    Spouse name: Not on file   Number of children: Not on file   Years of education: Not on file   Highest education level: Not on file  Occupational History   Not on file  Tobacco Use   Smoking status: Former    Pack years: 0.00    Types: Cigarettes   Smokeless tobacco: Never  Substance and Sexual Activity   Alcohol use: Yes    Comment: Bottle of wine per week   Drug use: Not on file   Sexual activity: Not on file  Other Topics  Concern   Not on file  Social History Narrative   Not on file   Social Determinants of Health   Financial Resource Strain: Not on file  Food Insecurity: Not on file  Transportation Needs: Not on file  Physical Activity: Not on file  Stress: Not on file  Social Connections: Not on file     Family History: The patient's family history includes Lung cancer in his father.  ROS:   Review of Systems  Constitution: Negative for decreased appetite, fever and weight gain.  HENT: Negative for congestion, ear discharge, hoarse voice and sore throat.   Eyes: Negative for discharge, redness, vision loss in right eye and visual halos.  Cardiovascular: Negative for chest pain, dyspnea on exertion, leg  swelling, orthopnea and palpitations.  Respiratory: Negative for cough, hemoptysis, shortness of breath and snoring.   Endocrine: Negative for heat intolerance and polyphagia.  Hematologic/Lymphatic: Negative for bleeding problem. Does not bruise/bleed easily.  Skin: Negative for flushing, nail changes, rash and suspicious lesions.  Musculoskeletal: Negative for arthritis, joint pain, muscle cramps, myalgias, neck pain and stiffness.  Gastrointestinal: Negative for abdominal pain, bowel incontinence, diarrhea and excessive appetite.  Genitourinary: Negative for decreased libido, genital sores and incomplete emptying.  Neurological: Negative for brief paralysis, focal weakness, headaches and loss of balance.  Psychiatric/Behavioral: Negative for altered mental status, depression and suicidal ideas.  Allergic/Immunologic: Negative for HIV exposure and persistent infections.    EKGs/Labs/Other Studies Reviewed:    The following studies were reviewed today:   EKG:  The ekg ordered today demonstrates   Echo IMPRESSIONS  10/15/2019  1. GLS -11.4%. Left ventricular ejection fraction, by estimation, is 50 to 55%. The left ventricle has low normal function. The left ventricle has  no regional wall motion abnormalities. There is mild left ventricular  hypertrophy. Left ventricular  diastolic parameters are consistent with Grade I diastolic dysfunction  (impaired relaxation).   2. Right ventricular systolic function is normal. The right ventricular  size is normal. There is mildly elevated pulmonary artery systolic  pressure.   3. The mitral valve is normal in structure. No evidence of mitral valve  regurgitation. No evidence of mitral stenosis.   4. The aortic valve is normal in structure. Aortic valve regurgitation is  not visualized. No aortic stenosis is present.   5. There is mild to moderate dilatation of the ascending aorta measuring  40 mm.   6. The inferior vena cava is normal in size  with greater than 50%  respiratory variability, suggesting right atrial pressure of 3 mmHg.   CCTA FINDINGS:   Image quality: excellent.   Noise artifact is: Limited.   Coronary Arteries:  Normal coronary origin.  Right dominance.   Left main: The left main is a large caliber vessel with a normal  take off from the left coronary cusp that trifurcates into a LAD, LCX, and ramus intermedius. There is no plaque or stenosis.   Left anterior descending artery: The proximal LAD contains mild mixed density plaque (25-49%). The mid and distal LAD segments contain minimal noncalcified plaque (<25%). The LAD gives off 2 patent diagonal branches.   Ramus intermedius: Small vessel that is patent with no evidence of plaque or stenosis.   Left circumflex artery: The LCX is non-dominant. The proximal LCX contains minimal calcified plaque (<25%). The mid LCX contains minimal non-calcified plaque (<25%). The LCX gives off 2 patent obtuse marginal branches.   Right coronary artery: The RCA is dominant with normal take off  from the right coronary cusp. The proximal RCA contains mild non-calcified plaque (25-49%). The mid and distal RCA contains minimal calcified plaque (<25%). The RCA terminates as a PDA and right posterolateral branch without evidence of plaque or stenosis.   Right Atrium: Right atrial size is within normal limits.   Right Ventricle: The right ventricular cavity is within normal limits.   Left Atrium: Left atrial size is normal in size with no left atrial appendage filling defect. A small PFO is present.   Left Ventricle: The ventricular cavity size is within normal limits. There are no stigmata of prior infarction. There is no abnormal filling defect.   Pulmonary arteries: Normal in size without proximal filling defect.   Pulmonary veins: Normal pulmonary venous drainage.   Pericardium: Normal thickness with no significant effusion or calcium present.   Cardiac  valves: The aortic valve is trileaflet without significant calcification. The mitral valve is normal structure without significant calcification.   Aorta: Normal caliber (root up to 38 mm; ascending aorta up to 37 mm) with no significant disease.   Extra-cardiac findings: See attached radiology report for non-cardiac structures.   IMPRESSION: 1. Coronary calcium score of 186. This was 40th percentile for age and sex matched controls.   2. Normal coronary origin with right dominance.   3. Mild CAD in the proximal LAD and proximal RCA (25-49%).   4. Minimal CAD in the LCX (<25%).   5. A small PFO is present.   RECOMMENDATIONS: Mild non-obstructive CAD (25-49%). Consider non-atherosclerotic causes of chest pain. Consider preventive therapy and risk factor modification.    Recent Labs: 11/13/2019: BUN 20; Creatinine, Ser 1.10; Hemoglobin 13.7; Platelets 214; Potassium 4.0; Sodium 139  Recent Lipid Panel No results found for: CHOL, TRIG, HDL, CHOLHDL, VLDL, LDLCALC, LDLDIRECT  Physical Exam:    VS:  BP 132/68   Pulse 85   Ht 6\' 1"  (1.854 m)   Wt 197 lb 3.2 oz (89.4 kg)   SpO2 98%   BMI 26.02 kg/m     Wt Readings from Last 3 Encounters:  08/09/20 197 lb 3.2 oz (89.4 kg)  02/04/20 199 lb (90.3 kg)  11/26/19 198 lb 12.8 oz (90.2 kg)     GEN: Well nourished, well developed in no acute distress HEENT: Normal NECK: No JVD; No carotid bruits LYMPHATICS: No lymphadenopathy CARDIAC: S1S2 noted,RRR, no murmurs, rubs, gallops RESPIRATORY:  Clear to auscultation without rales, wheezing or rhonchi  ABDOMEN: Soft, non-tender, non-distended, +bowel sounds, no guarding. EXTREMITIES: No edema, No cyanosis, no clubbing MUSCULOSKELETAL:  No deformity  SKIN: Warm and dry NEUROLOGIC:  Alert and oriented x 3, non-focal PSYCHIATRIC:  Normal affect, good insight  ASSESSMENT:    1. Coronary artery disease involving native coronary artery of native heart, unspecified whether angina  present   2. Primary hypertension   3. PVC (premature ventricular contraction)   4. Mild CAD   5. Hypertriglyceridemia   6. Type 2 diabetes mellitus without complication, without long-term current use of insulin ([place])    PLAN:     1.  We will continue patient's current medication regimen.  He has no anginal symptoms.  Blood pressure is acceptable in the office as well. He is looking forward to his visit to [place] for his son's wedding September.  The patient is in agreement with the above plan. The patient left the office in stable condition.  The patient will follow up in 1 year or sooner if needed.   Medication Adjustments/Labs and Tests Ordered:  Current medicines are reviewed at length with the patient today.  Concerns regarding medicines are outlined above.  No orders of the defined types were placed in this encounter.  No orders of the defined types were placed in this encounter.   Patient Instructions  Medication Instructions:  Your physician recommends that you continue on your current medications as directed. Please refer to the Current Medication list given to you today.  *If you need a refill on your cardiac medications before your next appointment, please call your pharmacy*   Lab Work: None If you have labs (blood work) drawn today and your tests are completely normal, you will receive your results only by: [place] (if you have MyChart) OR A paper copy in the mail If you have any lab test that is abnormal or we need to change your treatment, we will call you to review the results.   Testing/Procedures: None   Follow-Up: At [facility], you and your health needs are our priority.  As part of our continuing mission to provide you with exceptional heart care, we have created designated Provider Care Teams.  These Care Teams include your primary Cardiologist (physician) and Advanced Practice Providers (APPs -  Physician Assistants and Nurse  Practitioners) who all work together to provide you with the care you need, when you need it.  We recommend signing up for the patient portal called "MyChart".  Sign up information is provided on this After Visit Summary.  MyChart is used to connect with patients for Virtual Visits (Telemedicine).  Patients are able to view lab/test results, encounter notes, upcoming appointments, etc.  Non-urgent messages can be sent to your provider as well.   To learn more about what you can do with MyChart, go to [email or web address].    Your next appointment:   1 year(s)  The format for your next appointment:   In Person    Other Instructions    Adopting a Healthy Lifestyle.  Know what a healthy weight is for you (roughly BMI <25) and aim to maintain this   Aim for 7+ servings of fruits and vegetables daily   65-80+ fluid ounces of water or unsweet tea for healthy kidneys   Limit to max 1 drink of alcohol per day; avoid smoking/tobacco   Limit animal fats in diet for cholesterol and heart health - choose grass fed whenever available   Avoid highly processed foods, and foods high in saturated/trans fats   Aim for low stress - take time to unwind and care for your mental health   Aim for 150 min of moderate intensity exercise weekly for heart health, and weights twice weekly for bone health   Aim for 7-9 hours of sleep daily   When it comes to diets, agreement about the perfect plan isnt easy to find, even among the experts. Experts at the [employer] developed an idea known as the Healthy Eating Plate. Just imagine a plate divided into logical, healthy portions.   The emphasis is on diet quality:   Load up on vegetables and fruits - one-half of your plate: Aim for color and variety, and remember that potatoes dont count.   Go for whole grains - one-quarter of your plate: Whole wheat, barley, wheat berries, quinoa, oats, brown rice, and foods made with them. If  you want pasta, go with whole wheat pasta.   Protein power - one-quarter of your plate: Fish, chicken, beans, and nuts are all healthy, versatile protein sources.  Limit red meat.   The diet, however, does go beyond the plate, offering a few other suggestions.   Use healthy plant oils, such as olive, canola, soy, corn, sunflower and peanut. Check the labels, and avoid partially hydrogenated oil, which have unhealthy trans fats.   If youre thirsty, drink water. Coffee and tea are good in moderation, but skip sugary drinks and limit milk and dairy products to one or two daily servings.   The type of carbohydrate in the diet is more important than the amount. Some sources of carbohydrates, such as vegetables, fruits, whole grains, and beans-are healthier than others.   Finally, stay active  Signed, Berniece Salines, DO  08/09/2020 9:35 AM    [place]

## 2020-08-09 NOTE — Patient Instructions (Signed)
Medication Instructions:  Your physician recommends that you continue on your current medications as directed. Please refer to the Current Medication list given to you today.  *If you need a refill on your cardiac medications before your next appointment, please call your pharmacy*   Lab Work: None If you have labs (blood work) drawn today and your tests are completely normal, you will receive your results only by: [place] (if you have MyChart) OR A paper copy in the mail If you have any lab test that is abnormal or we need to change your treatment, we will call you to review the results.   Testing/Procedures: None   Follow-Up: At [facility], you and your health needs are our priority.  As part of our continuing mission to provide you with exceptional heart care, we have created designated Provider Care Teams.  These Care Teams include your primary Cardiologist (physician) and Advanced Practice Providers (APPs -  Physician Assistants and Nurse Practitioners) who all work together to provide you with the care you need, when you need it.  We recommend signing up for the patient portal called "MyChart".  Sign up information is provided on this After Visit Summary.  MyChart is used to connect with patients for Virtual Visits (Telemedicine).  Patients are able to view lab/test results, encounter notes, upcoming appointments, etc.  Non-urgent messages can be sent to your provider as well.   To learn more about what you can do with MyChart, go to [email or web address].    Your next appointment:   1 year(s)  The format for your next appointment:   In Person    Other Instructions

## 2020-10-12 ENCOUNTER — Other Ambulatory Visit: Payer: Self-pay | Admitting: Cardiology

## 2020-10-19 ENCOUNTER — Other Ambulatory Visit: Payer: Self-pay | Admitting: Cardiology

## 2021-03-13 DIAGNOSIS — E1140 Type 2 diabetes mellitus with diabetic neuropathy, unspecified: Secondary | ICD-10-CM | POA: Diagnosis not present

## 2021-03-13 DIAGNOSIS — Z125 Encounter for screening for malignant neoplasm of prostate: Secondary | ICD-10-CM | POA: Diagnosis not present

## 2021-03-13 DIAGNOSIS — Z79899 Other long term (current) drug therapy: Secondary | ICD-10-CM | POA: Diagnosis not present

## 2021-03-13 DIAGNOSIS — E781 Pure hyperglyceridemia: Secondary | ICD-10-CM | POA: Diagnosis not present

## 2021-03-16 DIAGNOSIS — Z1331 Encounter for screening for depression: Secondary | ICD-10-CM | POA: Diagnosis not present

## 2021-03-16 DIAGNOSIS — E1165 Type 2 diabetes mellitus with hyperglycemia: Secondary | ICD-10-CM | POA: Diagnosis not present

## 2021-03-16 DIAGNOSIS — Z6827 Body mass index (BMI) 27.0-27.9, adult: Secondary | ICD-10-CM | POA: Diagnosis not present

## 2021-03-16 DIAGNOSIS — Z0000 Encounter for general adult medical examination without abnormal findings: Secondary | ICD-10-CM | POA: Diagnosis not present

## 2021-03-16 DIAGNOSIS — Z6826 Body mass index (BMI) 26.0-26.9, adult: Secondary | ICD-10-CM | POA: Diagnosis not present

## 2021-03-29 DIAGNOSIS — E663 Overweight: Secondary | ICD-10-CM | POA: Diagnosis not present

## 2021-03-29 DIAGNOSIS — I10 Essential (primary) hypertension: Secondary | ICD-10-CM | POA: Diagnosis not present

## 2021-03-29 DIAGNOSIS — I2510 Atherosclerotic heart disease of native coronary artery without angina pectoris: Secondary | ICD-10-CM | POA: Diagnosis not present

## 2021-03-29 DIAGNOSIS — E785 Hyperlipidemia, unspecified: Secondary | ICD-10-CM | POA: Diagnosis not present

## 2021-03-29 DIAGNOSIS — E1142 Type 2 diabetes mellitus with diabetic polyneuropathy: Secondary | ICD-10-CM | POA: Diagnosis not present

## 2021-03-29 DIAGNOSIS — G2581 Restless legs syndrome: Secondary | ICD-10-CM | POA: Diagnosis not present

## 2021-03-29 DIAGNOSIS — H9193 Unspecified hearing loss, bilateral: Secondary | ICD-10-CM | POA: Diagnosis not present

## 2021-03-29 DIAGNOSIS — E1169 Type 2 diabetes mellitus with other specified complication: Secondary | ICD-10-CM | POA: Diagnosis not present

## 2021-03-29 DIAGNOSIS — G44019 Episodic cluster headache, not intractable: Secondary | ICD-10-CM | POA: Diagnosis not present

## 2021-03-29 DIAGNOSIS — E1165 Type 2 diabetes mellitus with hyperglycemia: Secondary | ICD-10-CM | POA: Diagnosis not present

## 2021-03-29 DIAGNOSIS — Z7982 Long term (current) use of aspirin: Secondary | ICD-10-CM | POA: Diagnosis not present

## 2021-03-29 DIAGNOSIS — E538 Deficiency of other specified B group vitamins: Secondary | ICD-10-CM | POA: Diagnosis not present

## 2021-05-13 ENCOUNTER — Other Ambulatory Visit: Payer: Self-pay | Admitting: Cardiology

## 2021-05-15 IMAGING — CT CT HEART MORP W/ CTA COR W/ SCORE W/ CA W/CM &/OR W/O CM
1 of 2 series · 8 of 20 positions shown, 10 images · non-contrast
Comparison: None.
COMPARISON: None.

Addendum:
EXAM:
OVER-READ INTERPRETATION  CT CHEST

The following report is an over-read performed by radiologist Dr.
Ohno Defendi [REDACTED] on 11/20/2019. This
over-read does not include interpretation of cardiac or coronary
anatomy or pathology. The coronary calcium score/coronary CTA
interpretation by the cardiologist is attached.
CLINICAL DATA: Chest pain
Cardiac/Coronary CTA
TECHNIQUE: The patient was scanned on a Phillips Force scanner. A 100 kV
prospective scan was triggered in the descending thoracic aorta at
111 HU's. Axial non-contrast 3 mm slices were carried out through
the heart. The data set was analyzed on a dedicated work station and
scored using the Agatson method. Gantry rotation speed was 250 msecs
and collimation was .6 mm. No beta blockade and 0.8 mg of sl NTG was
given. The 3D data set was reconstructed in 5% intervals of the
35-75 % of the R-R cycle. Diastolic phases were analyzed on a
dedicated work station using MPR, MIP and VRT modes. The patient
received 80 cc of contrast.

[Series 413: findings · 8 of 24 slices shown, 10 images]
[im 3/24  vessel]
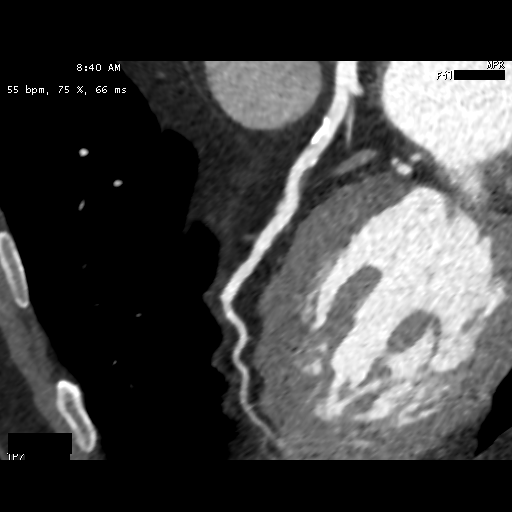
[im 3/24  lung]
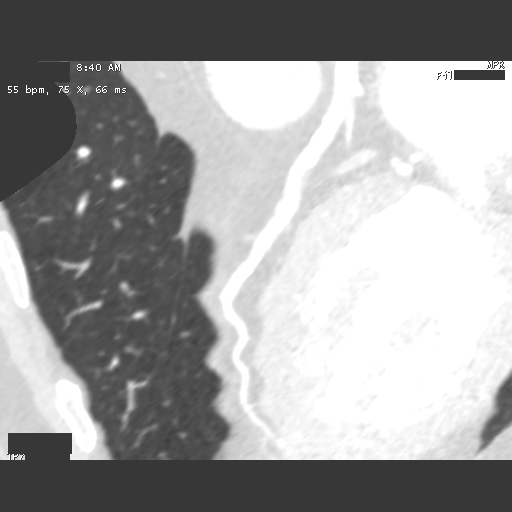
[im 6/24  vessel]
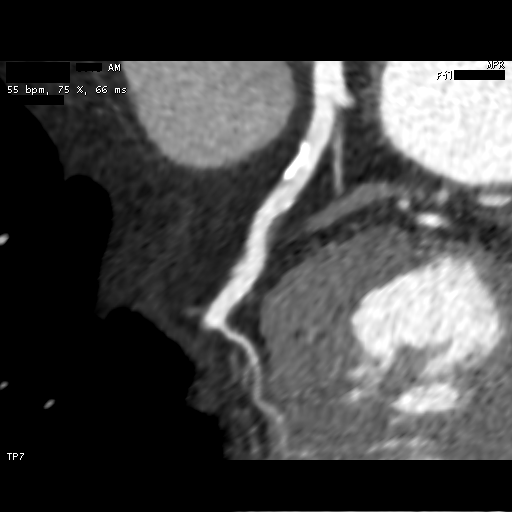
[im 8/24  vessel]
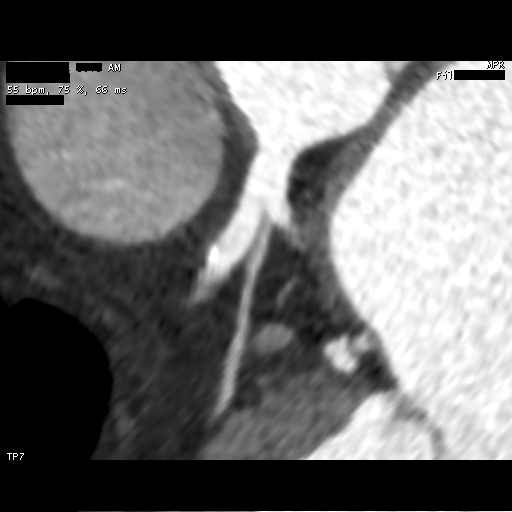
[im 11/24  vessel]
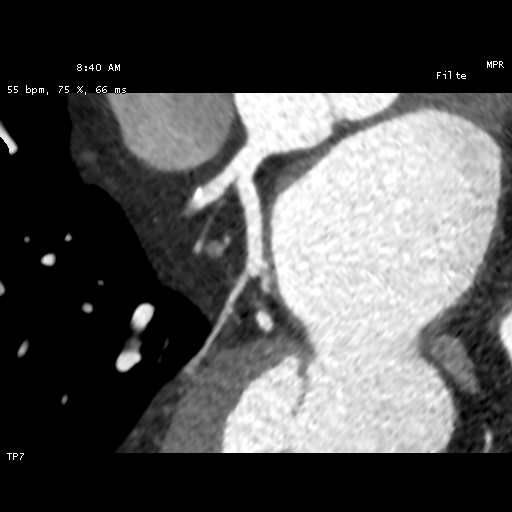
[im 13/24  vessel]
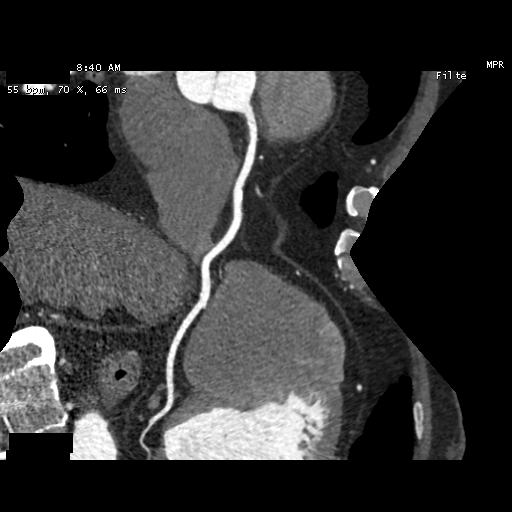
[im 13/24  lung]
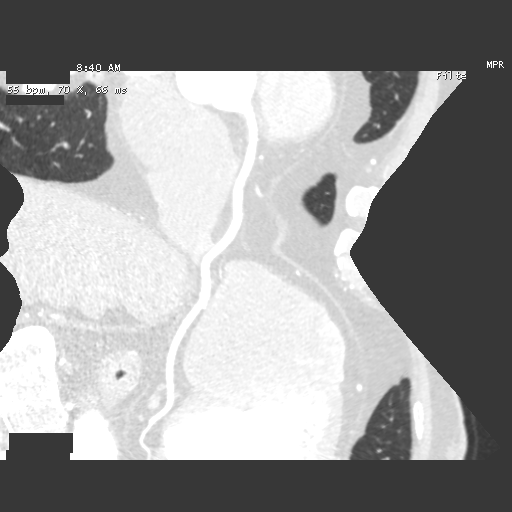
[im 16/24  vessel]
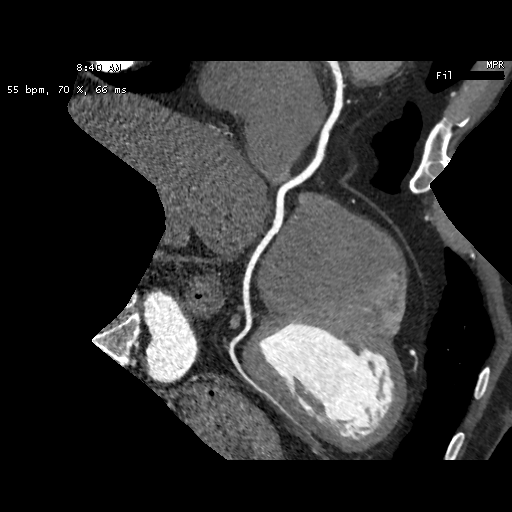
[im 18/24  vessel]
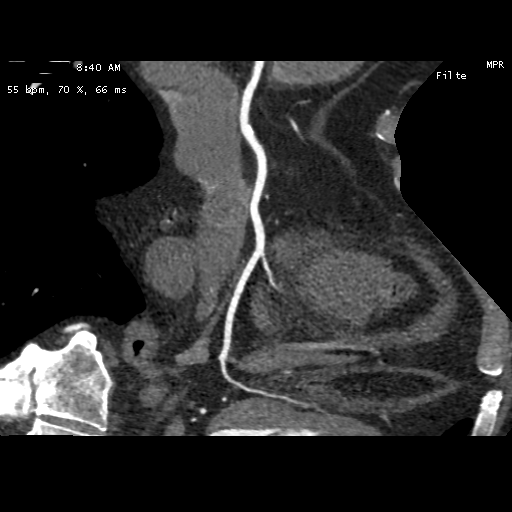
[im 21/24  vessel]
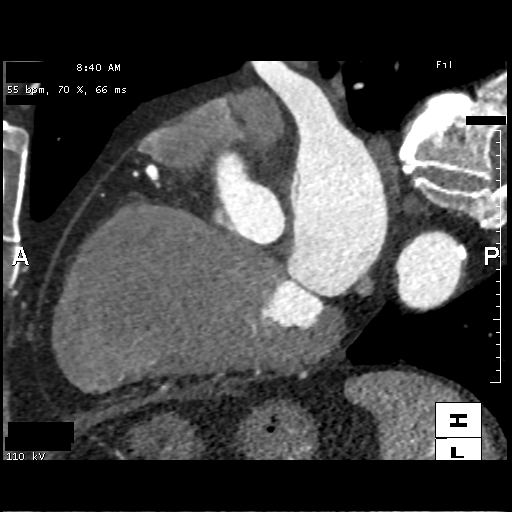

[8 of 20 positions shown; findings below may reference images not displayed]

FINDINGS: Aortic atherosclerosis. Within the visualized portions of the thorax
there are no suspicious appearing pulmonary nodules or masses, there
is no acute consolidative airspace disease, no pleural effusions, no
pneumothorax and no lymphadenopathy. Visualized portions of the
upper abdomen are unremarkable. There are no aggressive appearing
lytic or blastic lesions noted in the visualized portions of the
skeleton.
IMPRESSION: 1.  Aortic Atherosclerosis (KEI2K-8CR.R).
FINDINGS: Image quality: excellent.

Noise artifact is: Limited.

Coronary Arteries:  Normal coronary origin.  Right dominance.

Left main: The left main is a large caliber vessel with a normal
take off from the left coronary cusp that trifurcates into a LAD,
LCX, and ramus intermedius. There is no plaque or stenosis.

Left anterior descending artery: The proximal LAD contains mild
mixed density plaque (25-49%). The mid and distal LAD segments
contain minimal noncalcified plaque (<25%). The LAD gives off 2
patent diagonal branches.

Ramus intermedius: Small vessel that is patent with no evidence of
plaque or stenosis.

Left circumflex artery: The LCX is non-dominant. The proximal LCX
contains minimal calcified plaque (<25%). The mid LCX contains
minimal non-calcified plaque (<25%). The LCX gives off 2 patent
obtuse marginal branches.

Right coronary artery: The RCA is dominant with normal take off from
the right coronary cusp. The proximal RCA contains mild
non-calcified plaque (25-49%). The mid and distal RCA contains
minimal calcified plaque (<25%). The RCA terminates as a PDA and
right posterolateral branch without evidence of plaque or stenosis.

Right Atrium: Right atrial size is within normal limits.

Right Ventricle: The right ventricular cavity is within normal
limits.

Left Atrium: Left atrial size is normal in size with no left atrial
appendage filling defect. A small PFO is present.

Left Ventricle: The ventricular cavity size is within normal limits.
There are no stigmata of prior infarction. There is no abnormal
filling defect.

Pulmonary arteries: Normal in size without proximal filling defect.

Pulmonary veins: Normal pulmonary venous drainage.

Pericardium: Normal thickness with no significant effusion or
calcium present.

Cardiac valves: The aortic valve is trileaflet without significant
calcification. The mitral valve is normal structure without
significant calcification.

Aorta: Normal caliber (root up to 38 mm; ascending aorta up to 37
mm) with no significant disease.

Extra-cardiac findings: See attached radiology report for
non-cardiac structures.
IMPRESSION: 1. Coronary calcium score of 186. This was 40th percentile for age
and sex matched controls.

2. Normal coronary origin with right dominance.

3. Mild CAD in the proximal LAD and proximal RCA (25-49%).

4. Minimal CAD in the LCX (<25%).

5. A small PFO is present.

RECOMMENDATIONS:
1. Mild non-obstructive CAD (25-49%). Consider non-atherosclerotic
causes of chest pain. Consider preventive therapy and risk factor
modification.

*** End of Addendum ***
EXAM:
OVER-READ INTERPRETATION  CT CHEST

The following report is an over-read performed by radiologist Dr.
Ohno Defendi [REDACTED] on 11/20/2019. This
over-read does not include interpretation of cardiac or coronary
anatomy or pathology. The coronary calcium score/coronary CTA
interpretation by the cardiologist is attached.
FINDINGS: Aortic atherosclerosis. Within the visualized portions of the thorax
there are no suspicious appearing pulmonary nodules or masses, there
is no acute consolidative airspace disease, no pleural effusions, no
pneumothorax and no lymphadenopathy. Visualized portions of the
upper abdomen are unremarkable. There are no aggressive appearing
lytic or blastic lesions noted in the visualized portions of the
skeleton.
IMPRESSION: 1.  Aortic Atherosclerosis (KEI2K-8CR.R).

## 2021-06-07 DIAGNOSIS — E1140 Type 2 diabetes mellitus with diabetic neuropathy, unspecified: Secondary | ICD-10-CM | POA: Diagnosis not present

## 2021-06-09 DIAGNOSIS — E1140 Type 2 diabetes mellitus with diabetic neuropathy, unspecified: Secondary | ICD-10-CM | POA: Diagnosis not present

## 2021-06-09 DIAGNOSIS — Z6827 Body mass index (BMI) 27.0-27.9, adult: Secondary | ICD-10-CM | POA: Diagnosis not present

## 2021-11-24 DIAGNOSIS — Z23 Encounter for immunization: Secondary | ICD-10-CM | POA: Diagnosis not present

## 2021-11-24 DIAGNOSIS — E1140 Type 2 diabetes mellitus with diabetic neuropathy, unspecified: Secondary | ICD-10-CM | POA: Diagnosis not present

## 2021-11-28 DIAGNOSIS — Z6827 Body mass index (BMI) 27.0-27.9, adult: Secondary | ICD-10-CM | POA: Diagnosis not present

## 2021-11-28 DIAGNOSIS — E1140 Type 2 diabetes mellitus with diabetic neuropathy, unspecified: Secondary | ICD-10-CM | POA: Diagnosis not present

## 2022-01-03 DIAGNOSIS — Z23 Encounter for immunization: Secondary | ICD-10-CM | POA: Diagnosis not present

## 2022-02-16 DIAGNOSIS — M79671 Pain in right foot: Secondary | ICD-10-CM | POA: Diagnosis not present

## 2022-02-16 DIAGNOSIS — M7661 Achilles tendinitis, right leg: Secondary | ICD-10-CM | POA: Diagnosis not present

## 2022-03-07 ENCOUNTER — Telehealth: Payer: Self-pay | Admitting: Cardiology

## 2022-03-07 ENCOUNTER — Other Ambulatory Visit: Payer: Self-pay

## 2022-03-07 MED ORDER — ROSUVASTATIN CALCIUM 10 MG PO TABS: 10.0000 mg | ORAL_TABLET | Freq: Every day | ORAL | 3 refills | Status: DC

## 2022-03-07 NOTE — Telephone Encounter (Signed)
Called patient's wife and informed her that the patient's Rosuvastatin had been refilled and sent to his pharmacy. She was appreciative for the call and had no further questions at this time.

## 2022-03-07 NOTE — Telephone Encounter (Signed)
Patient needs refills on medications, used to see [facility] now has TOC to Dr Raliegh Ip in April (first available)   Rosuvastatin at [facility] number to reach pt [phone]  Patient has ran out, so putting high priority on call

## 2022-03-12 DIAGNOSIS — M25571 Pain in right ankle and joints of right foot: Secondary | ICD-10-CM | POA: Diagnosis not present

## 2022-03-21 DIAGNOSIS — M25571 Pain in right ankle and joints of right foot: Secondary | ICD-10-CM | POA: Diagnosis not present

## 2022-03-30 DIAGNOSIS — M25571 Pain in right ankle and joints of right foot: Secondary | ICD-10-CM | POA: Diagnosis not present

## 2022-04-06 DIAGNOSIS — M25571 Pain in right ankle and joints of right foot: Secondary | ICD-10-CM | POA: Diagnosis not present

## 2022-04-13 DIAGNOSIS — M25571 Pain in right ankle and joints of right foot: Secondary | ICD-10-CM | POA: Diagnosis not present

## 2022-04-27 DIAGNOSIS — M25571 Pain in right ankle and joints of right foot: Secondary | ICD-10-CM | POA: Diagnosis not present

## 2022-05-10 DIAGNOSIS — E1140 Type 2 diabetes mellitus with diabetic neuropathy, unspecified: Secondary | ICD-10-CM | POA: Diagnosis not present

## 2022-05-10 DIAGNOSIS — Z125 Encounter for screening for malignant neoplasm of prostate: Secondary | ICD-10-CM | POA: Diagnosis not present

## 2022-05-10 DIAGNOSIS — E781 Pure hyperglyceridemia: Secondary | ICD-10-CM | POA: Diagnosis not present

## 2022-05-14 DIAGNOSIS — Z1331 Encounter for screening for depression: Secondary | ICD-10-CM | POA: Diagnosis not present

## 2022-05-14 DIAGNOSIS — Z6827 Body mass index (BMI) 27.0-27.9, adult: Secondary | ICD-10-CM | POA: Diagnosis not present

## 2022-05-14 DIAGNOSIS — E1140 Type 2 diabetes mellitus with diabetic neuropathy, unspecified: Secondary | ICD-10-CM | POA: Diagnosis not present

## 2022-05-14 DIAGNOSIS — Z0000 Encounter for general adult medical examination without abnormal findings: Secondary | ICD-10-CM | POA: Diagnosis not present

## 2022-05-15 ENCOUNTER — Telehealth: Payer: Self-pay

## 2022-05-15 NOTE — Telephone Encounter (Signed)
error 

## 2022-06-14 ENCOUNTER — Encounter: Payer: Self-pay | Admitting: Cardiology

## 2022-06-14 ENCOUNTER — Ambulatory Visit: Payer: HMO | Attending: Cardiology | Admitting: Cardiology

## 2022-06-14 VITALS — BP 144/66 | HR 80 | Ht 73.0 in | Wt 204.0 lb

## 2022-06-14 DIAGNOSIS — R0609 Other forms of dyspnea: Secondary | ICD-10-CM

## 2022-06-14 DIAGNOSIS — E782 Mixed hyperlipidemia: Secondary | ICD-10-CM

## 2022-06-14 DIAGNOSIS — E119 Type 2 diabetes mellitus without complications: Secondary | ICD-10-CM | POA: Diagnosis not present

## 2022-06-14 DIAGNOSIS — I2510 Atherosclerotic heart disease of native coronary artery without angina pectoris: Secondary | ICD-10-CM | POA: Diagnosis not present

## 2022-06-14 NOTE — Progress Notes (Signed)
Cardiology Office Note:    Date:  06/14/2022   ID:  Tracey Harries, DOB [birth year], MRN [id number]  PCP:  Marylen Ponto, MD  Cardiologist:  Gypsy Balsam, MD    Referring MD: Marylen Ponto, MD   Chief Complaint  Patient presents with   Medication Management  Doing well  History of Present Illness:    Donald Arias is a 82 y.o. male past medical history significant for coronary artery disease, in 2021 he got coronary CT angio done which showed up to 49% stenosis right coronary artery as well as mid LAD.  He is diabetic does have history of dyslipidemia excellently managed by primary care physician.  Comes today to months for follow-up overall he is trying to be active.  He exercised on the regular basis he walks his dog he described that he does have some exertional shortness of breath but overall not bad.  Denies have any chest pain tightness squeezing pressure burning chest.  He tells me that since he has been put on Crestor he is feeling dramatically better.  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 Jennye Boroughs   POLYPECTOMY  06/29/2003   Diminutive Hyperplastic polyps   PROSTATE BIOPSY     For elevated PSA    Current Medications: Current Meds  Medication Sig   aspirin EC 81 MG tablet Take 81 mg by mouth daily. Swallow whole.    cholecalciferol (VITAMIN  D3) 25 MCG (1000 UNIT) tablet Take 1,000 Units by mouth daily.    CINNAMON PO Take 1 capsule by mouth daily.   Coenzyme Q10 100 MG TABS Take 1 tablet by mouth daily.   lisinopril-hydrochlorothiazide (ZESTORETIC) 10-12.5 MG tablet Take 1 tablet by mouth daily.   Multiple Vitamins-Minerals (CENTRUM SILVER PO) Take 1 tablet by mouth daily.   nitroGLYCERIN (NITROSTAT) 0.4 MG SL tablet Place 1 tablet (0.4 mg total) under the tongue every 5 (five) minutes as needed for chest pain.   Omega-3 Fatty Acids (OMEGA 3 500 PO) Take 1 capsule by mouth daily.   rosuvastatin (CRESTOR) 10 MG tablet Take 1 tablet (10 mg total) by mouth daily.   sitaGLIPtin (JANUVIA) 100 MG tablet Take 100 mg by mouth daily.   verapamil (CALAN-SR) 240 MG CR tablet Take 240 mg by mouth at bedtime.     Allergies:   Isosorbide   Social History   Socioeconomic History   Marital status: Married    Spouse name: Not on file   Number of children: Not on file   Years of education: Not on file   Highest education level: Not on file  Occupational History   Not on file  Tobacco Use   Smoking status: Former    Types: Cigarettes   Smokeless tobacco: Never  Substance and Sexual Activity   Alcohol use: Yes    Comment: Bottle of wine per week  Drug use: Not on file   Sexual activity: Not on file  Other Topics Concern   Not on file  Social History Narrative   Not on file   Social Determinants of Health   Financial Resource Strain: Not on file  Food Insecurity: Not on file  Transportation Needs: Not on file  Physical Activity: Not on file  Stress: Not on file  Social Connections: Not on file     Family History: The patient's family history includes Lung cancer in his father. ROS:   Please see the history of present illness.    All 14 point review of systems negative except as described per history of present illness  EKGs/Labs/Other Studies Reviewed:      Recent Labs: No results found for requested labs within  last 365 days.  Recent Lipid Panel No results found for: "CHOL", "TRIG", "HDL", "CHOLHDL", "VLDL", "LDLCALC", "LDLDIRECT"  Physical Exam:    VS:  BP (!) 144/66 (BP Location: Left Arm, Patient Position: Sitting)   Pulse 80   Ht  (1.854 m)   Wt 204 lb (92.5 kg)   SpO2 96%   BMI 26.91 kg/m     Wt Readings from Last 3 Encounters:  06/14/22 204 lb (92.5 kg)  08/09/20 197 lb 3.2 oz (89.4 kg)  02/04/20 199 lb (90.3 kg)     GEN:  Well nourished, well developed in no acute distress HEENT: Normal NECK: No JVD; No carotid bruits LYMPHATICS: No lymphadenopathy CARDIAC: RRR, no murmurs, no rubs, no gallops RESPIRATORY:  Clear to auscultation without rales, wheezing or rhonchi  ABDOMEN: Soft, non-tender, non-distended MUSCULOSKELETAL:  No edema; No deformity  SKIN: Warm and dry LOWER EXTREMITIES: no swelling NEUROLOGIC:  Alert and oriented x 3 PSYCHIATRIC:  Normal affect   ASSESSMENT:    1. Mild CAD   2. Type 2 diabetes mellitus without complication, without long-term current use of insulin   3. Mixed hyperlipidemia   4. Dyspnea on exertion    PLAN:    In order of problems listed above:  Coronary artery disease only mild based on coronary CT angio from 2021, does not have any symptomatology that would suggest worsening of the problem.  The key is risk factors modifications, he is already on antiplatelet therapy in form of aspirin which I will continue. Dyslipidemia I did review his blood work done by primary care physician which show excellent cholesterol with LDL of 37 HDL 41.  Will continue present management. Type 2 diabetes followed by internal medicine team.  Doing well from that point review with last hemoglobin A1c less than 7.  We did talk about need to exercise on the regular basis which he normally does.  I will schedule him to have echocardiogram make sure he does not have and a diminished left ventricle ejection fraction and I will see him back in 1  year.   Medication Adjustments/Labs and Tests Ordered: Current medicines are reviewed at length with the patient today.  Concerns regarding medicines are outlined above.  Orders Placed This Encounter  Procedures   EKG 12-Lead   ECHOCARDIOGRAM COMPLETE   Medication changes: No orders of the defined types were placed in this encounter.   Signed, Georgeanna Lea, MD, [facility] 06/14/2022 12:05 PM    [facility]

## 2022-06-14 NOTE — Patient Instructions (Addendum)

## 2022-06-19 ENCOUNTER — Ambulatory Visit: Payer: HMO | Attending: Cardiology

## 2022-06-19 DIAGNOSIS — R0609 Other forms of dyspnea: Secondary | ICD-10-CM | POA: Diagnosis not present

## 2022-06-19 LAB — ECHOCARDIOGRAM COMPLETE
Area-P 1/2: 3.42 cm2
MV M vel: 2.7 m/s
MV Peak grad: 29.2 mmHg
P 1/2 time: 358 msec
S' Lateral: 3.3 cm

## 2022-06-25 ENCOUNTER — Telehealth: Payer: Self-pay

## 2022-06-25 NOTE — Telephone Encounter (Signed)
Echo Results reviewed with pt's spouse per DPR as per Dr. Krasowski's note.  Spouse verbalized understanding and had no additional questions. Routed to PCP 

## 2022-10-24 DIAGNOSIS — H9193 Unspecified hearing loss, bilateral: Secondary | ICD-10-CM | POA: Diagnosis not present

## 2022-10-24 DIAGNOSIS — E1142 Type 2 diabetes mellitus with diabetic polyneuropathy: Secondary | ICD-10-CM | POA: Diagnosis not present

## 2022-10-24 DIAGNOSIS — Z7982 Long term (current) use of aspirin: Secondary | ICD-10-CM | POA: Diagnosis not present

## 2022-10-24 DIAGNOSIS — E1136 Type 2 diabetes mellitus with diabetic cataract: Secondary | ICD-10-CM | POA: Diagnosis not present

## 2022-10-24 DIAGNOSIS — E1169 Type 2 diabetes mellitus with other specified complication: Secondary | ICD-10-CM | POA: Diagnosis not present

## 2022-10-24 DIAGNOSIS — H259 Unspecified age-related cataract: Secondary | ICD-10-CM | POA: Diagnosis not present

## 2022-10-24 DIAGNOSIS — E785 Hyperlipidemia, unspecified: Secondary | ICD-10-CM | POA: Diagnosis not present

## 2022-10-24 DIAGNOSIS — E781 Pure hyperglyceridemia: Secondary | ICD-10-CM | POA: Diagnosis not present

## 2022-10-24 DIAGNOSIS — G44019 Episodic cluster headache, not intractable: Secondary | ICD-10-CM | POA: Diagnosis not present

## 2022-10-24 DIAGNOSIS — I10 Essential (primary) hypertension: Secondary | ICD-10-CM | POA: Diagnosis not present

## 2022-10-24 DIAGNOSIS — E663 Overweight: Secondary | ICD-10-CM | POA: Diagnosis not present

## 2022-10-24 DIAGNOSIS — E1140 Type 2 diabetes mellitus with diabetic neuropathy, unspecified: Secondary | ICD-10-CM | POA: Diagnosis not present

## 2022-10-24 DIAGNOSIS — G2581 Restless legs syndrome: Secondary | ICD-10-CM | POA: Diagnosis not present

## 2022-10-24 DIAGNOSIS — M1990 Unspecified osteoarthritis, unspecified site: Secondary | ICD-10-CM | POA: Diagnosis not present

## 2022-10-26 DIAGNOSIS — Z6827 Body mass index (BMI) 27.0-27.9, adult: Secondary | ICD-10-CM | POA: Diagnosis not present

## 2022-10-26 DIAGNOSIS — I10 Essential (primary) hypertension: Secondary | ICD-10-CM | POA: Diagnosis not present

## 2022-10-26 DIAGNOSIS — E1140 Type 2 diabetes mellitus with diabetic neuropathy, unspecified: Secondary | ICD-10-CM | POA: Diagnosis not present

## 2022-10-26 DIAGNOSIS — Z23 Encounter for immunization: Secondary | ICD-10-CM | POA: Diagnosis not present

## 2023-01-14 ENCOUNTER — Other Ambulatory Visit: Payer: Self-pay

## 2023-01-14 MED ORDER — ROSUVASTATIN CALCIUM 10 MG PO TABS: 10.0000 mg | ORAL_TABLET | Freq: Every day | ORAL | 0 refills | Status: DC

## 2023-05-17 ENCOUNTER — Other Ambulatory Visit: Payer: Self-pay | Admitting: Cardiology

## 2023-05-27 DIAGNOSIS — D2239 Melanocytic nevi of other parts of face: Secondary | ICD-10-CM | POA: Diagnosis not present

## 2023-05-27 DIAGNOSIS — L821 Other seborrheic keratosis: Secondary | ICD-10-CM | POA: Diagnosis not present

## 2023-05-27 DIAGNOSIS — L820 Inflamed seborrheic keratosis: Secondary | ICD-10-CM | POA: Diagnosis not present

## 2023-05-27 DIAGNOSIS — L814 Other melanin hyperpigmentation: Secondary | ICD-10-CM | POA: Diagnosis not present

## 2023-05-27 DIAGNOSIS — L280 Lichen simplex chronicus: Secondary | ICD-10-CM | POA: Diagnosis not present

## 2023-05-27 DIAGNOSIS — D485 Neoplasm of uncertain behavior of skin: Secondary | ICD-10-CM | POA: Diagnosis not present

## 2023-06-03 DIAGNOSIS — Z125 Encounter for screening for malignant neoplasm of prostate: Secondary | ICD-10-CM | POA: Diagnosis not present

## 2023-06-03 DIAGNOSIS — E1140 Type 2 diabetes mellitus with diabetic neuropathy, unspecified: Secondary | ICD-10-CM | POA: Diagnosis not present

## 2023-06-03 DIAGNOSIS — Z79899 Other long term (current) drug therapy: Secondary | ICD-10-CM | POA: Diagnosis not present

## 2023-06-05 DIAGNOSIS — Z6827 Body mass index (BMI) 27.0-27.9, adult: Secondary | ICD-10-CM | POA: Diagnosis not present

## 2023-06-05 DIAGNOSIS — Z0000 Encounter for general adult medical examination without abnormal findings: Secondary | ICD-10-CM | POA: Diagnosis not present

## 2023-06-05 DIAGNOSIS — Z1339 Encounter for screening examination for other mental health and behavioral disorders: Secondary | ICD-10-CM | POA: Diagnosis not present

## 2023-06-05 DIAGNOSIS — E1140 Type 2 diabetes mellitus with diabetic neuropathy, unspecified: Secondary | ICD-10-CM | POA: Diagnosis not present

## 2023-06-20 ENCOUNTER — Encounter: Payer: Self-pay | Admitting: Cardiology

## 2023-06-20 ENCOUNTER — Ambulatory Visit: Attending: Cardiology | Admitting: Cardiology

## 2023-06-20 ENCOUNTER — Telehealth: Payer: Self-pay | Admitting: Cardiology

## 2023-06-20 VITALS — BP 124/66 | HR 78 | Ht 73.0 in | Wt 207.2 lb

## 2023-06-20 DIAGNOSIS — R0609 Other forms of dyspnea: Secondary | ICD-10-CM

## 2023-06-20 DIAGNOSIS — I2510 Atherosclerotic heart disease of native coronary artery without angina pectoris: Secondary | ICD-10-CM

## 2023-06-20 DIAGNOSIS — I493 Ventricular premature depolarization: Secondary | ICD-10-CM

## 2023-06-20 DIAGNOSIS — R072 Precordial pain: Secondary | ICD-10-CM

## 2023-06-20 DIAGNOSIS — E782 Mixed hyperlipidemia: Secondary | ICD-10-CM | POA: Diagnosis not present

## 2023-06-20 MED ORDER — ROSUVASTATIN CALCIUM 10 MG PO TABS: 10.0000 mg | ORAL_TABLET | Freq: Every day | ORAL | 3 refills | Status: DC

## 2023-06-20 MED ORDER — NITROGLYCERIN 0.4 MG SL SUBL: 0.4000 mg | SUBLINGUAL_TABLET | SUBLINGUAL | 6 refills | Status: AC | PRN

## 2023-06-20 NOTE — Progress Notes (Signed)
 Cardiology Office Note:    Date:  06/20/2023   ID:  Donald Arias, DOB 1940-08-22, MRN [id number]  PCP:  Gaither Juba, MD  Cardiologist:  Ralene Burger, MD    Referring MD: Gaither Juba, MD   Chief Complaint  Patient presents with   Follow-up    History of Present Illness:    Donald Arias is a 83 y.o. male past medical history significant for coronary disease in 2021 he had coronary CT angio which showed up to 49% stenosis of the mid LAD as well as RCA.  Diabetic dyslipidemia overall doing well however described 2 episode of chest pain since have seen her last time.  1 episodes when he was walking.  Lasting for about 20 to 30 seconds.  Otherwise he is doing fine.  He is to walk on the regular basis with his dog however his dog passed.  He is thinking about getting another dog.  He went to visit his family in [place], and of having COVID there and have to spend extra 2 weeks but recovered completely and was only mild case  Past Medical History:  Diagnosis Date   Chest pain of uncertain etiology 09/28/2019   Depressed left ventricular ejection fraction 09/28/2019   Diabetes mellitus with neuropathy (HCC)    Dilatation of aorta (HCC) 10/29/2019   Hiatal hernia 03/14/2013   Found during EGD procedure   Hypertension    Hypertriglyceridemia    Migraine-cluster headache syndrome    Precordial pain 10/29/2019   PVC (premature ventricular contraction) 09/28/2019   Thyroid  nodule    Type 2 diabetes mellitus without complication, without long-term current use of insulin (HCC) 09/28/2019    Past Surgical History:  Procedure Laterality Date   ESOPHAGEAL DILATION  06/2000   Mild esophageal stricture dilated, Dr. Monico Anna   POLYPECTOMY  06/29/2003   Diminutive Hyperplastic polyps   PROSTATE BIOPSY     For elevated PSA    Current Medications: Current Meds  Medication Sig   ALPHA LIPOIC ACID PO Take 1 tablet by mouth daily.   aspirin EC 81 MG tablet Take 81 mg by mouth daily.  Swallow whole.    Bioflavonoid Products (BIOFLEX PO) Take 1 capsule by mouth daily.   cholecalciferol (VITAMIN D3) 25 MCG (1000 UNIT) tablet Take 1,000 Units by mouth daily.    CHROMIUM PO Take 1 tablet by mouth daily.   glipiZIDE (GLUCOTROL XL) 2.5 MG 24 hr tablet Take 2.5 mg by mouth daily.   lisinopril-hydrochlorothiazide (ZESTORETIC) 10-12.5 MG tablet Take 1 tablet by mouth daily.   MAGNESIUM PO Take 1 tablet by mouth daily.   Multiple Vitamins-Minerals (CENTRUM SILVER PO) Take 1 tablet by mouth daily.   Multiple Vitamins-Minerals (CITRACAL +D3 PO) Take 1 tablet by mouth daily.   multivitamin-lutein (OCUVITE-LUTEIN) CAPS capsule Take 1 capsule by mouth daily.   nitroGLYCERIN  (NITROSTAT ) 0.4 MG SL tablet Place 1 tablet (0.4 mg total) under the tongue every 5 (five) minutes as needed for chest pain.   Omega-3 Fatty Acids (OMEGA 3 500 PO) Take 1 capsule by mouth daily.   pregabalin (LYRICA) 75 MG capsule Take 75 mg by mouth at bedtime.   rosuvastatin  (CRESTOR ) 10 MG tablet Take 1 tablet (10 mg total) by mouth daily. Final attempt, patient needs to keep 05/2023 appt for additional refills   sitaGLIPtin (JANUVIA) 100 MG tablet Take 100 mg by mouth daily.   verapamil (CALAN-SR) 240 MG CR tablet Take 240 mg by mouth at bedtime.   [DISCONTINUED] CINNAMON  PO Take 1 capsule by mouth daily.   [DISCONTINUED] Coenzyme Q10 100 MG TABS Take 1 tablet by mouth daily.     Allergies:   Isosorbide    Social History   Socioeconomic History   Marital status: Married    Spouse name: Not on file   Number of children: Not on file   Years of education: Not on file   Highest education level: Not on file  Occupational History   Not on file  Tobacco Use   Smoking status: Former    Types: Cigarettes   Smokeless tobacco: Never  Substance and Sexual Activity   Alcohol use: Yes    Comment: Bottle of wine per week   Drug use: Not on file   Sexual activity: Not on file  Other Topics Concern   Not on file   Social History Narrative   Not on file   Social Drivers of Health   Financial Resource Strain: Not on file  Food Insecurity: Not on file  Transportation Needs: Not on file  Physical Activity: Not on file  Stress: Not on file  Social Connections: Not on file     Family History: The patient's family history includes Lung cancer in his father. ROS:   Please see the history of present illness.    All 14 point review of systems negative except as described per history of present illness  EKGs/Labs/Other Studies Reviewed:    EKG Interpretation Date/Time:  Thursday June 20 2023 09:26:21 EDT Ventricular Rate:  78 PR Interval:  162 QRS Duration:  90 QT Interval:  408 QTC Calculation: 465 R Axis:   -37  Text Interpretation: Sinus rhythm with occasional Premature ventricular complexes Left axis deviation Abnormal ECG No previous ECGs available Confirmed by Ralene Burger [phone]) on 06/20/2023 9:32:38 AM    Recent Labs: No results found for requested labs within last 365 days.  Recent Lipid Panel No results found for: "CHOL", "TRIG", "HDL", "CHOLHDL", "VLDL", "LDLCALC", "LDLDIRECT"  Physical Exam:    VS:  BP 124/66 (BP Location: Right Arm, Patient Position: Sitting)   Pulse 78   Ht 6\' 1"  (1.854 m)   Wt 207 lb 3.2 oz (94 kg)   SpO2 94%   BMI 27.34 kg/m     Wt Readings from Last 3 Encounters:  06/20/23 207 lb 3.2 oz (94 kg)  06/14/22 204 lb (92.5 kg)  08/09/20 197 lb 3.2 oz (89.4 kg)     GEN:  Well nourished, well developed in no acute distress HEENT: Normal NECK: No JVD; No carotid bruits LYMPHATICS: No lymphadenopathy CARDIAC: RRR, no murmurs, no rubs, no gallops RESPIRATORY:  Clear to auscultation without rales, wheezing or rhonchi  ABDOMEN: Soft, non-tender, non-distended MUSCULOSKELETAL:  No edema; No deformity  SKIN: Warm and dry LOWER EXTREMITIES: no swelling NEUROLOGIC:  Alert and oriented x 3 PSYCHIATRIC:  Normal affect   ASSESSMENT:    1. Dyspnea  on exertion   2. Mild CAD   3. PVC (premature ventricular contraction)   4. Precordial pain   5. Mixed hyperlipidemia    PLAN:    In order of problems listed above:  Chest pain somewhat atypical but with history of mild coronary artery disease that he deserves to have a stress test we will schedule her to have Lexiscan . Dyspnea on exertion stable. PVCs no palpitations no dizziness no passing out. Dyslipidemia I did review K PN done by primary care physician total cholesterol 99 HDL 40 we will continue present management.   Medication  Adjustments/Labs and Tests Ordered: Current medicines are reviewed at length with the patient today.  Concerns regarding medicines are outlined above.  Orders Placed This Encounter  Procedures   EKG 12-Lead   Medication changes: No orders of the defined types were placed in this encounter.   Signed, Manfred Seed, MD, [facility] 06/20/2023 9:54 AM    [facility]

## 2023-06-20 NOTE — Addendum Note (Signed)
 Addended by: Shawnee Dellen D on: 06/20/2023 09:58 AM   Modules accepted: Orders

## 2023-06-20 NOTE — Addendum Note (Signed)
 Addended by: Shawnee Dellen D on: 06/20/2023 01:45 PM   Modules accepted: Orders

## 2023-06-20 NOTE — Patient Instructions (Addendum)
 Medication Instructions:  Your physician recommends that you continue on your current medications as directed. Please refer to the Current Medication list given to you today.  *If you need a refill on your cardiac medications before your next appointment, please call your pharmacy*   Lab Work: None Ordered If you have labs (blood work) drawn today and your tests are completely normal, you will receive your results only by: MyChart Message (if you have MyChart) OR A paper copy in the mail If you have any lab test that is abnormal or we need to change your treatment, we will call you to review the results.   Testing/Procedures: Your physician has requested that you have a lexiscan myoview. For further information please visit [email or web address] Please follow instruction sheet, as given.  The test will take approximately 3 to 4 hours to complete; you may bring reading material.  If someone comes with you to your appointment, they will need to remain in the main lobby due to limited space in the testing area.   How to prepare for your Myocardial Perfusion Test: Do not eat or drink 3 hours prior to your test, except you may have water. Do not consume products containing caffeine (regular or decaffeinated) 12 hours prior to your test. (ex: coffee, chocolate, sodas, tea). Do bring a list of your current medications with you.  If not listed below, you may take your medications as normal. Do wear comfortable clothes (no dresses or overalls) and walking shoes, tennis shoes preferred (No heels or open toe shoes are allowed). Do NOT wear cologne, perfume, aftershave, or lotions (deodorant is allowed). If these instructions are not followed, your test will have to be rescheduled.     Follow-Up: At [facility], you and your health needs are our priority.  As part of our continuing mission to provide you with exceptional heart care, we have created designated Provider Care Teams.  These Care Teams  include your primary Cardiologist (physician) and Advanced Practice Providers (APPs -  Physician Assistants and Nurse Practitioners) who all work together to provide you with the care you need, when you need it.  We recommend signing up for the patient portal called "MyChart".  Sign up information is provided on this After Visit Summary.  MyChart is used to connect with patients for Virtual Visits (Telemedicine).  Patients are able to view lab/test results, encounter notes, upcoming appointments, etc.  Non-urgent messages can be sent to your provider as well.   To learn more about what you can do with MyChart, go to [email or web address].    Your next appointment:   12 month(s)  The format for your next appointment:   In Person  Provider:   Gypsy Balsam, MD    Other Instructions NA

## 2023-06-20 NOTE — Telephone Encounter (Signed)
*  STAT* If patient is at the pharmacy, call can be transferred to refill team.   1. Which medications need to be refilled? (please list name of each medication and dose if known)   nitroGLYCERIN  (NITROSTAT ) 0.4 MG SL tablet (Expired)   (completely out) rosuvastatin  (CRESTOR ) 10 MG tablet   (a couple of tablets left  2. Would you like to learn more about the convenience, safety, & potential cost savings by using the [facility]?   3. Are you open to using the [facility] (Type [facility]. ).  4. Which pharmacy/location (including street and city if local pharmacy) is medication to be sent to?  [facility] - [place], [place] - [address]   5. Do they need a 30 day or 90 day supply? 90 day

## 2023-06-27 NOTE — Addendum Note (Signed)
 Addended by: Ralene Burger on: 06/27/2023 12:25 PM   Modules accepted: Orders

## 2023-07-05 ENCOUNTER — Telehealth (HOSPITAL_COMMUNITY): Payer: Self-pay | Admitting: *Deleted

## 2023-07-05 NOTE — Telephone Encounter (Signed)
 Patient given detailed instructions per Myocardial Perfusion Study Information Sheet for the test on 07/10/2023 at 8:00. Patient notified to arrive 15 minutes early and that it is imperative to arrive on time for appointment to keep from having the test rescheduled.  If you need to cancel or reschedule your appointment, please call the office within 24 hours of your appointment. . Patient verbalized understanding.Anne Barrios

## 2023-07-10 ENCOUNTER — Ambulatory Visit: Attending: Cardiology

## 2023-07-10 DIAGNOSIS — R072 Precordial pain: Secondary | ICD-10-CM

## 2023-07-10 MED ORDER — REGADENOSON 0.4 MG/5ML IV SOLN
0.4000 mg | Freq: Once | INTRAVENOUS | Status: AC
  Administered 2023-07-10: 0.4 mg via INTRAVENOUS

## 2023-07-10 MED ORDER — TECHNETIUM TC 99M TETROFOSMIN IV KIT
10.2000 | PACK | Freq: Once | INTRAVENOUS | Status: AC | PRN
  Administered 2023-07-10: 10.2 via INTRAVENOUS

## 2023-07-10 MED ORDER — TECHNETIUM TC 99M TETROFOSMIN IV KIT
31.3000 | PACK | Freq: Once | INTRAVENOUS | Status: AC | PRN
  Administered 2023-07-10: 31.3 via INTRAVENOUS

## 2023-07-14 ENCOUNTER — Ambulatory Visit: Payer: Self-pay | Admitting: Cardiology

## 2023-07-14 LAB — MYOCARDIAL PERFUSION IMAGING
LV dias vol: 105 mL (ref 62–150)
LV sys vol: 43 mL
Nuc Stress EF: 58 %
Peak HR: 82 {beats}/min
Rest HR: 61 {beats}/min
Rest Nuclear Isotope Dose: 10.2 mCi
SDS: 0
SRS: 8
SSS: 8
ST Depression (mm): 0 mm
Stress Nuclear Isotope Dose: 31.3 mCi
TID: 0.99

## 2023-07-16 ENCOUNTER — Telehealth: Payer: Self-pay

## 2023-07-16 NOTE — Telephone Encounter (Signed)
 Left message on My Chart with normal stress test results per Dr. Vanetta Shawl note. Routed to PCP.

## 2023-07-17 ENCOUNTER — Telehealth: Payer: Self-pay

## 2023-07-17 NOTE — Telephone Encounter (Signed)
 Pt viewed Stress results on My Chart per Dr. Tonja Fray note. Routed to PCP.

## 2023-10-30 DIAGNOSIS — E1140 Type 2 diabetes mellitus with diabetic neuropathy, unspecified: Secondary | ICD-10-CM | POA: Diagnosis not present

## 2023-11-01 DIAGNOSIS — E1165 Type 2 diabetes mellitus with hyperglycemia: Secondary | ICD-10-CM | POA: Diagnosis not present

## 2023-11-01 DIAGNOSIS — Z6827 Body mass index (BMI) 27.0-27.9, adult: Secondary | ICD-10-CM | POA: Diagnosis not present

## 2023-11-01 DIAGNOSIS — G4700 Insomnia, unspecified: Secondary | ICD-10-CM | POA: Diagnosis not present

## 2023-11-05 DIAGNOSIS — M170 Bilateral primary osteoarthritis of knee: Secondary | ICD-10-CM | POA: Diagnosis not present

## 2023-11-15 DIAGNOSIS — Z23 Encounter for immunization: Secondary | ICD-10-CM | POA: Diagnosis not present

## 2023-12-17 DIAGNOSIS — I10 Essential (primary) hypertension: Secondary | ICD-10-CM | POA: Diagnosis not present

## 2023-12-19 DIAGNOSIS — Z6825 Body mass index (BMI) 25.0-25.9, adult: Secondary | ICD-10-CM | POA: Diagnosis not present

## 2023-12-19 DIAGNOSIS — E1140 Type 2 diabetes mellitus with diabetic neuropathy, unspecified: Secondary | ICD-10-CM | POA: Diagnosis not present

## 2023-12-19 DIAGNOSIS — R3911 Hesitancy of micturition: Secondary | ICD-10-CM | POA: Diagnosis not present

## 2023-12-23 DIAGNOSIS — E785 Hyperlipidemia, unspecified: Secondary | ICD-10-CM | POA: Diagnosis not present

## 2023-12-23 DIAGNOSIS — H9193 Unspecified hearing loss, bilateral: Secondary | ICD-10-CM | POA: Diagnosis not present

## 2023-12-23 DIAGNOSIS — Z87891 Personal history of nicotine dependence: Secondary | ICD-10-CM | POA: Diagnosis not present

## 2023-12-23 DIAGNOSIS — Z7982 Long term (current) use of aspirin: Secondary | ICD-10-CM | POA: Diagnosis not present

## 2023-12-23 DIAGNOSIS — G8929 Other chronic pain: Secondary | ICD-10-CM | POA: Diagnosis not present

## 2023-12-23 DIAGNOSIS — E1169 Type 2 diabetes mellitus with other specified complication: Secondary | ICD-10-CM | POA: Diagnosis not present

## 2023-12-23 DIAGNOSIS — G44019 Episodic cluster headache, not intractable: Secondary | ICD-10-CM | POA: Diagnosis not present

## 2023-12-23 DIAGNOSIS — E1142 Type 2 diabetes mellitus with diabetic polyneuropathy: Secondary | ICD-10-CM | POA: Diagnosis not present

## 2023-12-23 DIAGNOSIS — E663 Overweight: Secondary | ICD-10-CM | POA: Diagnosis not present

## 2023-12-23 DIAGNOSIS — M1990 Unspecified osteoarthritis, unspecified site: Secondary | ICD-10-CM | POA: Diagnosis not present

## 2023-12-23 DIAGNOSIS — I10 Essential (primary) hypertension: Secondary | ICD-10-CM | POA: Diagnosis not present

## 2024-01-02 DIAGNOSIS — E119 Type 2 diabetes mellitus without complications: Secondary | ICD-10-CM | POA: Diagnosis not present

## 2024-01-30 DIAGNOSIS — R351 Nocturia: Secondary | ICD-10-CM | POA: Diagnosis not present

## 2024-01-30 DIAGNOSIS — N401 Enlarged prostate with lower urinary tract symptoms: Secondary | ICD-10-CM | POA: Diagnosis not present

## 2024-01-30 DIAGNOSIS — Z79899 Other long term (current) drug therapy: Secondary | ICD-10-CM | POA: Diagnosis not present

## 2024-03-26 ENCOUNTER — Telehealth: Payer: Self-pay | Admitting: Cardiology

## 2024-03-26 MED ORDER — ROSUVASTATIN CALCIUM 10 MG PO TABS: 10.0000 mg | ORAL_TABLET | Freq: Every day | ORAL | 0 refills | Status: AC

## 2024-03-26 NOTE — Telephone Encounter (Signed)
 Called pt and spoke with wife who answered who also spoke with pt while on the phone with her. Advised of pharmacy stating he picked up the prescription 13 days ago.  She checked and the pt does have medication to last through April. 1 yr f/u setup for 05/21 with Krasowski in [place]. 90 day supply sent to pharmacy to last him until appt.

## 2024-03-26 NOTE — Telephone Encounter (Signed)
 Patient is requesting a refill on his statins before we get the snow this weekend. States the pharmacy has tried to reach out to us  with no response back. CB # [phone]

## 2024-03-26 NOTE — Telephone Encounter (Signed)
 Called pharmacy to confirm need for rx due to prescription being sent in April 2025 for a yr supply.   Pharmacy advised they do have April refill and pt picked up prescription 13 days ago. Advised I would contact pt to advise further.

## 2024-07-16 ENCOUNTER — Ambulatory Visit: Admitting: Cardiology
